# Patient Record
Sex: Female | Born: 1980
Health system: Southern US, Community
[De-identification: ages and names within clinical notes are randomized; demographics above are authoritative.]

## PROBLEM LIST (undated history)

## (undated) DIAGNOSIS — D649 Anemia, unspecified: Secondary | ICD-10-CM

## (undated) DIAGNOSIS — D259 Leiomyoma of uterus, unspecified: Secondary | ICD-10-CM

## (undated) DIAGNOSIS — Z9889 Other specified postprocedural states: Secondary | ICD-10-CM

## (undated) DIAGNOSIS — F419 Anxiety disorder, unspecified: Secondary | ICD-10-CM

## (undated) DIAGNOSIS — R112 Nausea with vomiting, unspecified: Secondary | ICD-10-CM

## (undated) DIAGNOSIS — N809 Endometriosis, unspecified: Secondary | ICD-10-CM

## (undated) DIAGNOSIS — F329 Major depressive disorder, single episode, unspecified: Secondary | ICD-10-CM

## (undated) DIAGNOSIS — F32A Depression, unspecified: Secondary | ICD-10-CM

## (undated) HISTORY — PX: MYOMECTOMY VAGINAL APPROACH: SUR871

## (undated) HISTORY — PX: LAPAROSCOPIC GELPORT ASSISTED MYOMECTOMY: SHX6549

## (undated) HISTORY — DX: Anxiety disorder, unspecified: F41.9

## (undated) HISTORY — DX: Endometriosis, unspecified: N80.9

## (undated) HISTORY — DX: Major depressive disorder, single episode, unspecified: F32.9

## (undated) HISTORY — DX: Depression, unspecified: F32.A

## (undated) HISTORY — DX: Leiomyoma of uterus, unspecified: D25.9

---

## 2012-05-07 HISTORY — PX: DILATION AND CURETTAGE OF UTERUS: SHX78

## 2014-09-15 HISTORY — PX: DILATION AND CURETTAGE OF UTERUS: SHX78

## 2015-07-08 LAB — HM PAP SMEAR: HM Pap smear: NEGATIVE

## 2015-10-07 ENCOUNTER — Other Ambulatory Visit: Payer: Self-pay | Admitting: Obstetrics and Gynecology

## 2015-10-07 DIAGNOSIS — N979 Female infertility, unspecified: Secondary | ICD-10-CM

## 2015-11-09 ENCOUNTER — Ambulatory Visit
Admission: RE | Admit: 2015-11-09 | Discharge: 2015-11-09 | Disposition: A | Discharge status: Home / Self Care | Payer: Managed Care, Other (non HMO) | Source: Ambulatory Visit | Attending: Obstetrics and Gynecology | Admitting: Obstetrics and Gynecology

## 2015-11-09 DIAGNOSIS — N979 Female infertility, unspecified: Secondary | ICD-10-CM | POA: Insufficient documentation

## 2015-11-09 MED ORDER — IOHEXOL 300 MG/ML  SOLN
75.0000 mL | Freq: Once | INTRAMUSCULAR | Status: AC | PRN
  Administered 2015-11-09: 20 mL via INTRAVENOUS

## 2015-11-10 NOTE — Procedures (Signed)
Prep and drape done.  Cervix normal.  Tenaculum placed. Cervix not dilated manually.  Catheter placed into uterine cavity. Approximately 15 mL IsoVue 300 dye injected under fluoroscopic visualization.  Patent right fallopian tube seen and no spillage seen of the left fallopian tube and no flow into the tube.  The uterus initially appeared to have an abnormal contour.  However, after some time a contour within normal limits was apparent.  Catheter and tenaculum removed.  Patient stable, tolerated procedure well.

## 2018-01-01 ENCOUNTER — Other Ambulatory Visit (HOSPITAL_COMMUNITY)
Admission: RE | Admit: 2018-01-01 | Discharge: 2018-01-01 | Disposition: A | Discharge status: Home / Self Care | Payer: Managed Care, Other (non HMO) | Source: Ambulatory Visit | Attending: Obstetrics and Gynecology | Admitting: Obstetrics and Gynecology

## 2018-01-01 ENCOUNTER — Encounter: Payer: Self-pay | Admitting: Obstetrics and Gynecology

## 2018-01-01 ENCOUNTER — Ambulatory Visit (INDEPENDENT_AMBULATORY_CARE_PROVIDER_SITE_OTHER): Payer: Managed Care, Other (non HMO) | Admitting: Obstetrics and Gynecology

## 2018-01-01 VITALS — BP 140/90 | HR 114 | Ht 67.0 in | Wt 217.0 lb

## 2018-01-01 DIAGNOSIS — Z01419 Encounter for gynecological examination (general) (routine) without abnormal findings: Secondary | ICD-10-CM | POA: Insufficient documentation

## 2018-01-01 DIAGNOSIS — D259 Leiomyoma of uterus, unspecified: Secondary | ICD-10-CM

## 2018-01-01 DIAGNOSIS — G8929 Other chronic pain: Secondary | ICD-10-CM | POA: Diagnosis not present

## 2018-01-01 DIAGNOSIS — R102 Pelvic and perineal pain: Secondary | ICD-10-CM | POA: Diagnosis not present

## 2018-01-01 DIAGNOSIS — Z1339 Encounter for screening examination for other mental health and behavioral disorders: Secondary | ICD-10-CM

## 2018-01-01 DIAGNOSIS — Z1331 Encounter for screening for depression: Secondary | ICD-10-CM | POA: Diagnosis not present

## 2018-01-01 DIAGNOSIS — N809 Endometriosis, unspecified: Secondary | ICD-10-CM

## 2018-01-01 DIAGNOSIS — Z124 Encounter for screening for malignant neoplasm of cervix: Secondary | ICD-10-CM

## 2018-01-01 NOTE — Progress Notes (Signed)
Gynecology Annual Exam  PCP: Patient, No Pcp Per  Chief Complaint  Patient presents with  . Gynecologic Exam    Talk about hysterectomy/ having anxiety     History of Present Illness:  Ms. Ashley Mooney is a 37 y.o. G1P0010 who LMP was No LMP recorded. (Menstrual status: Irregular Periods)., presents today for her annual examination.  Her menses are irregular, painful, and very heavy.  The last period she had was so heavy that she almost went to the ER.  At that point she was soaking a pad every 1/2 - 1 hour. She even noticed a vision change in her right eye.  The most recent period was 4 days in length. In July her period was 12 days long.  More typical for her is a 6-7 days.  She has a history of two D&Cs (one for a miscarriage, one for heavy bleeding). She has had two laparoscopic myomectomies and two hysteroscopic myomectomies.  She would like to discuss hysterectomy.  She had a significant history of endometriosis and fibroids and has had multiple surgeries for the same.   She is single partner, contraception - none.  Last Pap: 07/07/2105  Results were: no abnormalities /neg HPV DNA negative Hx of STDs: none  Last mammogram: n/a There is no FH of breast cancer. There is no FH of ovarian cancer. The patient does not do self-breast exams.  Tobacco use: 3 cigarettes per day Alcohol use: social drinker Exercise: moderately active  The patient wears seatbelts: yes.      Past Medical History:  Diagnosis Date  . Anxiety   . Depression   . Endometriosis   . Fibroid, uterine     Past Surgical History:  Procedure Laterality Date  . DILATION AND CURETTAGE OF UTERUS  09/15/2014   miscarriage   . DILATION AND CURETTAGE OF UTERUS  2014   Heavy bleeding   . LAPAROSCOPIC GELPORT ASSISTED MYOMECTOMY  [id number]  . MYOMECTOMY VAGINAL APPROACH  2014, 2015    Medications   Denies    Allergies  Allergen Reactions  . Oxycodone Rash  . Zoloft [Sertraline Hcl] Rash   Gynecologic  History: No LMP recorded. (Menstrual status: Irregular Periods).  Obstetric History: G1P0010  Social History   Socioeconomic History  . Marital status: Married    Spouse name: Not on file  . Number of children: Not on file  . Years of education: Not on file  . Highest education level: Not on file  Occupational History  . Not on file  Social Needs  . Financial resource strain: Not on file  . Food insecurity:    Worry: Not on file    Inability: Not on file  . Transportation needs:    Medical: Not on file    Non-medical: Not on file  Tobacco Use  . Smoking status: Light Tobacco Smoker  . Smokeless tobacco: Never Used  Substance and Sexual Activity  . Alcohol use: Yes    Comment: occasionally   . Drug use: Never  . Sexual activity: Yes    Birth control/protection: None  Lifestyle  . Physical activity:    Days per week: Not on file    Minutes per session: Not on file  . Stress: Not on file  Relationships  . Social connections:    Talks on phone: Not on file    Gets together: Not on file    Attends religious service: Not on file    Active member of club or organization:  Not on file    Attends meetings of clubs or organizations: Not on file    Relationship status: Not on file  . Intimate partner violence:    Fear of current or ex partner: Not on file    Emotionally abused: Not on file    Physically abused: Not on file    Forced sexual activity: Not on file  Other Topics Concern  . Not on file  Social History Narrative  . Not on file    Family History  Problem Relation Age of Onset  . Lung cancer Maternal Grandfather   . Colon cancer Paternal Grandfather     Review of Systems  Constitutional: Negative.   HENT: Negative.   Eyes: Negative.   Respiratory: Negative.   Cardiovascular: Negative.   Gastrointestinal: Negative.   Genitourinary: Negative.   Musculoskeletal: Negative.   Skin: Negative.   Neurological: Negative.   Psychiatric/Behavioral: Negative.       Physical Exam BP 140/90   Pulse (!) 114   Ht 5\' 7"  (1.702 m)   Wt 217 lb (98.4 kg)   BMI 33.99 kg/m    Physical Exam  Constitutional: She is oriented to person, place, and time. She appears well-developed and well-nourished. No distress.  Genitourinary: Pelvic exam was performed with patient supine. There is no rash, tenderness, lesion or injury on the right labia. There is no rash, tenderness, lesion or injury on the left labia. No erythema, tenderness or bleeding in the vagina. No signs of injury around the vagina. No vaginal discharge found.  Right adnexum displays mass. Right adnexum does not display tenderness and does not display fullness.  Left adnexum displays mass. Left adnexum does not display tenderness and does not display fullness. Cervix does not exhibit motion tenderness, lesion, discharge or polyp.   Uterus is enlarged, exhibiting a mass, irregular and mobile (minimally). Uterus is not tender.  Genitourinary Comments: Large mass associated with uterus/ovaries.  Difficult to distinguish between fibroid uterus versus ovarian cysts. Bimanual confirms that the mass extends to just above the umbilicus and is irregular and minimally mobile.   HENT:  Head: Normocephalic and atraumatic.  Eyes: EOM are normal. No scleral icterus.  Neck: Normal range of motion. Neck supple. No thyromegaly present.  Cardiovascular: Normal rate and regular rhythm. Exam reveals no gallop and no friction rub.  No murmur heard. Pulmonary/Chest: Effort normal and breath sounds normal. No respiratory distress. She has no wheezes. She has no rales. Right breast exhibits no inverted nipple, no mass, no nipple discharge, no skin change and no tenderness. Left breast exhibits no inverted nipple, no mass, no nipple discharge, no skin change and no tenderness.  Abdominal: Soft. Bowel sounds are normal. She exhibits mass (extending to just above umbilicus and laterally for 6-7 cm bilaterally, minimally mobile).  She exhibits no distension. There is no tenderness. There is no rebound and no guarding.  Musculoskeletal: Normal range of motion. She exhibits no edema or tenderness.  Lymphadenopathy:    She has no cervical adenopathy.       Right: No inguinal adenopathy present.       Left: No inguinal adenopathy present.  Neurological: She is alert and oriented to person, place, and time. No cranial nerve deficit.  Skin: Skin is warm and dry. No rash noted. No erythema.  Psychiatric: She has a normal mood and affect. Her behavior is normal. Judgment normal.   Female chaperone present for pelvic and breast  portions of the physical exam  Results: AUDIT  Questionnaire (screen for alcoholism): 3 PHQ-9: 8 GAD-7: 8  Assessment: 37 y.o. G44P0010 female here for routine annual gynecologic examination.  Plan: Problem List Items Addressed This Visit      Genitourinary   Fibroid uterus   Relevant Orders   US PELVIS (TRANSABDOMINAL ONLY)   US PELVIS TRANSVANGINAL NON-OB (TV ONLY)     Other   Chronic pelvic pain in female   Endometriosis    Other Visit Diagnoses    Women's annual routine gynecological examination    -  Primary   Relevant Orders   US PELVIS (TRANSABDOMINAL ONLY)   US PELVIS TRANSVANGINAL NON-OB (TV ONLY)   Cytology - PAP   Screening for depression       Screening for alcoholism       Pap smear for cervical cancer screening       Relevant Orders   Cytology - PAP     Screening: -- Blood pressure screen elevated: continued to monitor. -- Colonoscopy - not due -- Mammogram - not due -- Weight screening: obese: discussed management options, including lifestyle, dietary, and exercise. -- Depression screening negative (PHQ-9) -- Nutrition: normal -- cholesterol screening: not due for screening -- osteoporosis screening: not due -- tobacco screening: using: discussed quitting using the 5 A's -- alcohol screening: AUDIT questionnaire indicates low-risk usage. -- family history of  breast cancer screening: done. not at high risk. -- no evidence of domestic violence or intimate partner violence. -- STD screening: gonorrhea/chlamydia NAAT not collected per patient request. -- pap smear collected per ASCCP guidelines -- HPV vaccination series: not eligilbe  Fibroid uterus: very large uterus with possible fibroids (most likely given her history of recurrent fibroids). Etiology could be ovarian in origin, also.  Pelvic ultrasound both transabdominal and transvaginal to better assess.  Chronic pelvic pain: patient has a long history of this. She is sure she no longer wants to become pregnant. She has tried for 13 years to become pregnant and has not been successful.  She now has new findings on exam that are concerning for worsening fibroid disease.  Will get ultrasound to better characterize her uterus.  She requests a hysterectomy.  Will utilize ultrasound findings to determine best approach.  20 minutes spent in face to face discussion with > 50% spent in counseling,management, and coordination of care of her chronic pelvic pain and fibroid uterus, as well as her decision whether to have a hysterectomy.    Prentice Docker, MD 01/01/2018 6:23 PM

## 2018-01-07 LAB — CYTOLOGY - PAP
DIAGNOSIS: NEGATIVE
HPV (WINDOPATH): NOT DETECTED

## 2018-01-10 ENCOUNTER — Ambulatory Visit (INDEPENDENT_AMBULATORY_CARE_PROVIDER_SITE_OTHER): Payer: Managed Care, Other (non HMO)

## 2018-01-10 ENCOUNTER — Encounter: Payer: Self-pay | Admitting: Obstetrics and Gynecology

## 2018-01-10 ENCOUNTER — Ambulatory Visit (INDEPENDENT_AMBULATORY_CARE_PROVIDER_SITE_OTHER): Payer: Managed Care, Other (non HMO) | Admitting: Obstetrics and Gynecology

## 2018-01-10 VITALS — BP 118/78 | Ht 67.0 in | Wt 214.0 lb

## 2018-01-10 DIAGNOSIS — R1905 Periumbilic swelling, mass or lump: Secondary | ICD-10-CM

## 2018-01-10 DIAGNOSIS — N921 Excessive and frequent menstruation with irregular cycle: Secondary | ICD-10-CM

## 2018-01-10 DIAGNOSIS — R102 Pelvic and perineal pain: Secondary | ICD-10-CM | POA: Diagnosis not present

## 2018-01-10 DIAGNOSIS — Z01419 Encounter for gynecological examination (general) (routine) without abnormal findings: Secondary | ICD-10-CM

## 2018-01-10 DIAGNOSIS — D251 Intramural leiomyoma of uterus: Secondary | ICD-10-CM

## 2018-01-10 DIAGNOSIS — N809 Endometriosis, unspecified: Secondary | ICD-10-CM | POA: Diagnosis not present

## 2018-01-10 DIAGNOSIS — D259 Leiomyoma of uterus, unspecified: Secondary | ICD-10-CM

## 2018-01-10 DIAGNOSIS — G8929 Other chronic pain: Secondary | ICD-10-CM

## 2018-01-10 MED ORDER — MEDROXYPROGESTERONE ACETATE 10 MG PO TABS: ORAL_TABLET | ORAL | 2 refills | Status: DC

## 2018-01-10 NOTE — Progress Notes (Signed)
Gynecology Ultrasound Follow Up   Chief Complaint  Patient presents with  . Follow-up    U/S Follow Up  Chronic pelvic pain in female, endometriosis, fibroid uterus   History of Present Illness: Patient is a 37 y.o. female who presents today for ultrasound evaluation of the above .  Ultrasound demonstrates the following findings Adnexa: no masses seen  Uterus: anteverted with endometrial stripe  7.0 mm Additional: Two fibroids: 1 is anterior intramural measuring 1.4 x 0.85 cm, the second is fundal intramural: 1.8 x 1.24 cm.   She is fairly certain she wants a hysterectomy still.   Past Medical History:  Diagnosis Date  . Anxiety   . Depression   . Endometriosis   . Fibroid, uterine     Past Surgical History:  Procedure Laterality Date  . DILATION AND CURETTAGE OF UTERUS  09/15/2014   miscarriage   . DILATION AND CURETTAGE OF UTERUS  2014   Heavy bleeding   . LAPAROSCOPIC GELPORT ASSISTED MYOMECTOMY  [id number]  . MYOMECTOMY VAGINAL APPROACH  2014, 2015    Family History  Problem Relation Age of Onset  . Lung cancer Maternal Grandfather   . Colon cancer Paternal Grandfather     Social History   Socioeconomic History  . Marital status: Married    Spouse name: Not on file  . Number of children: Not on file  . Years of education: Not on file  . Highest education level: Not on file  Occupational History  . Not on file  Social Needs  . Financial resource strain: Not on file  . Food insecurity:    Worry: Not on file    Inability: Not on file  . Transportation needs:    Medical: Not on file    Non-medical: Not on file  Tobacco Use  . Smoking status: Light Tobacco Smoker  . Smokeless tobacco: Never Used  Substance and Sexual Activity  . Alcohol use: Yes    Comment: occasionally   . Drug use: Never  . Sexual activity: Yes    Birth control/protection: None  Lifestyle  . Physical activity:    Days per week: Not on file    Minutes per session: Not on  file  . Stress: Not on file  Relationships  . Social connections:    Talks on phone: Not on file    Gets together: Not on file    Attends religious service: Not on file    Active member of club or organization: Not on file    Attends meetings of clubs or organizations: Not on file    Relationship status: Not on file  . Intimate partner violence:    Fear of current or ex partner: Not on file    Emotionally abused: Not on file    Physically abused: Not on file    Forced sexual activity: Not on file  Other Topics Concern  . Not on file  Social History Narrative  . Not on file    Allergies  Allergen Reactions  . Oxycodone Rash  . Zoloft [Sertraline Hcl] Rash    Prior to Admission medications   Not on File    Physical Exam BP 118/78   Ht 5\' 7"  (1.702 m)   Wt 214 lb (97.1 kg)   LMP 12/14/2017   BMI 33.52 kg/m    General: NAD HEENT: normocephalic, anicteric Pulmonary: No increased work of breathing Extremities: no edema, erythema, or tenderness Neurologic: Grossly intact, normal gait Psychiatric: mood appropriate, affect full  US Pelvis (transabdominal Only)  Result Date: 01/10/2018 ULTRASOUND REPORT Location: [facility] Date of Service: 01/10/2018 Patient Name: Ashley Mooney DOB: Sep 11, 1980 MRN: [id number] Indications:Enlarged Uterus Findings: The uterus is anteverted and measures 7.52 x 5.88 x 4.00 cm. Echo texture is heterogenous with evidence of focal masses. Within the uterus are multiple suspected fibroids measuring: Fibroid 1:Anterior intramural measures 14.03 x 8.51 mm Fibroid 2:Fundal intramural measures 18.23 x 12.41 mm The Endometrium measures 6.99 mm. Right Ovary measures 3.81 x 2.34 x 1.82  cm. It is normal in appearance. Left Ovary measures 1.55 x 1.26 x 0.67 cm. It is normal in appearance. Survey of the adnexa demonstrates no adnexal masses. There is no free fluid in the cul de sac. Impression: 1. Anteverted uterus with heterogenous texture. 2. Anterior  intramural measures 14.03 x 8.51 mm 3. Fundal intramural measures 18.23 x 12.41 mm Mital bahen P Patel, RDMS The ultrasound images and findings were reviewed by me and I agree with the above report. Prentice Docker, MD, [facility], [facility] 01/10/2018 11:37 AM      Assessment: 36 y.o. G1P0010  1. Menorrhagia with irregular cycle   2. Endometriosis   3. Chronic pelvic pain in female   4. Uterine leiomyoma, unspecified location   5. Periumbilical mass      Plan: Problem List Items Addressed This Visit      Genitourinary   Fibroid uterus     Other   Chronic pelvic pain in female   Relevant Medications   medroxyPROGESTERone (PROVERA) 10 MG tablet   Endometriosis   Relevant Medications   medroxyPROGESTERone (PROVERA) 10 MG tablet   Menorrhagia with irregular cycle - Primary   Relevant Medications   medroxyPROGESTERone (PROVERA) 10 MG tablet    Other Visit Diagnoses    Periumbilical mass       Relevant Orders   US Abdomen Complete     Reviewed u/s results. Ultrasound does not account for mass palpated on abdomen. She notified me that she has a horseshoe kidney and that might be the mass. Will obtain abdominal ultrasound to verify.  Discussed treatment options for pain, heavy periods, and fibroids. She has failed myomectomy in the past. She is strongly considering surgery, but is not 100% yet. As she has tried to conceive and has gone to great lengths to do so in the past 13 years, I would like her to be 150% certain.  Discussed alternatives, like IUD, etc. She is strongly against any mechanical devices in her body. She will consider and let me know. Will give her provera to take for heavy bleeding in the mean time. Instructions provided on how to take.   20 minutes spent in face to face discussion with > 50% spent in counseling,management, and coordination of care of her menorrhagia, chronic pelvic pain, endometriosis, abdominal mass.  Prentice Docker, MD,  [facility], [facility] 01/10/2018 1:39 PM

## 2018-01-14 ENCOUNTER — Ambulatory Visit
Admission: RE | Admit: 2018-01-14 | Discharge: 2018-01-14 | Disposition: A | Discharge status: Home / Self Care | Payer: Self-pay | Source: Ambulatory Visit | Attending: Obstetrics and Gynecology | Admitting: Obstetrics and Gynecology

## 2018-01-14 ENCOUNTER — Other Ambulatory Visit: Payer: Self-pay | Admitting: Obstetrics and Gynecology

## 2018-01-14 ENCOUNTER — Ambulatory Visit
Admission: RE | Admit: 2018-01-14 | Discharge: 2018-01-14 | Disposition: A | Discharge status: Home / Self Care | Payer: Managed Care, Other (non HMO) | Source: Ambulatory Visit | Attending: Obstetrics and Gynecology | Admitting: Obstetrics and Gynecology

## 2018-01-14 DIAGNOSIS — R1905 Periumbilic swelling, mass or lump: Secondary | ICD-10-CM

## 2018-01-17 ENCOUNTER — Telehealth: Payer: Self-pay | Admitting: Obstetrics and Gynecology

## 2018-05-09 ENCOUNTER — Telehealth: Payer: Self-pay | Admitting: Obstetrics and Gynecology

## 2018-05-09 NOTE — Telephone Encounter (Signed)
Patient called about scheduling an MRI for a mass at her stomach, but I do not see orders. Thanks.

## 2018-05-14 NOTE — Telephone Encounter (Signed)
Patient sent message to f/u. Patient is willing to either f/u in office again if you want to check for mass, have ultrasound, MRI, or whatever you recommend.

## 2018-05-15 ENCOUNTER — Other Ambulatory Visit: Payer: Self-pay | Admitting: Obstetrics and Gynecology

## 2018-05-15 DIAGNOSIS — R1900 Intra-abdominal and pelvic swelling, mass and lump, unspecified site: Secondary | ICD-10-CM

## 2018-05-15 DIAGNOSIS — N921 Excessive and frequent menstruation with irregular cycle: Secondary | ICD-10-CM

## 2018-05-15 DIAGNOSIS — D259 Leiomyoma of uterus, unspecified: Secondary | ICD-10-CM

## 2018-05-15 DIAGNOSIS — R1909 Other intra-abdominal and pelvic swelling, mass and lump: Secondary | ICD-10-CM

## 2018-05-15 NOTE — Telephone Encounter (Signed)
Of course I entered it wrong. The requested order is now in. Thank you.

## 2018-05-15 NOTE — Telephone Encounter (Signed)
Order entered.  Thank you

## 2018-05-15 NOTE — Telephone Encounter (Signed)
Patient is aware of MRI on Friday, 05/30/2018 @ 9:00am, to arrive at the [facility] registration desk at 8:30am, and that she must be NPO 4 hrs prior. Patient is aware to expect a call from the [facility].

## 2018-05-29 ENCOUNTER — Ambulatory Visit: Payer: Managed Care, Other (non HMO)

## 2018-05-30 ENCOUNTER — Ambulatory Visit
Admission: RE | Admit: 2018-05-30 | Discharge: 2018-05-30 | Disposition: A | Discharge status: Home / Self Care | Payer: BLUE CROSS/BLUE SHIELD | Source: Ambulatory Visit | Attending: Obstetrics and Gynecology | Admitting: Obstetrics and Gynecology

## 2018-05-30 ENCOUNTER — Telehealth: Payer: Self-pay | Admitting: Obstetrics and Gynecology

## 2018-05-30 DIAGNOSIS — D259 Leiomyoma of uterus, unspecified: Secondary | ICD-10-CM | POA: Diagnosis present

## 2018-05-30 DIAGNOSIS — R1900 Intra-abdominal and pelvic swelling, mass and lump, unspecified site: Secondary | ICD-10-CM | POA: Diagnosis not present

## 2018-05-30 DIAGNOSIS — R1909 Other intra-abdominal and pelvic swelling, mass and lump: Secondary | ICD-10-CM | POA: Diagnosis present

## 2018-05-30 MED ORDER — GADOBUTROL 1 MMOL/ML IV SOLN
9.0000 mL | Freq: Once | INTRAVENOUS | Status: AC | PRN
  Administered 2018-05-30: 9 mL via INTRAVENOUS

## 2018-05-30 NOTE — Telephone Encounter (Signed)
Discussed findings of MRI. She will make appointment to go over options as she is not so sure she wants a hysterectomy anymore.

## 2018-06-26 ENCOUNTER — Telehealth: Payer: Self-pay | Admitting: Obstetrics and Gynecology

## 2018-06-26 NOTE — Telephone Encounter (Signed)
Lmtrc in response to patient's MyChart message regarding charges for an MRI at [facility].

## 2018-06-30 NOTE — Telephone Encounter (Signed)
Patient called back this morning and said when she checked in at registration, she was told she would be responsible for ~$2000, then was asked if she changed insurance, she replied yes, and was told it looked like she was covered, and was given a printout showing estimated costs of $0. The patient received a bill for almost $4000. Patient is trying to understand why she owes the amount she was billed for. The patient is aware Jola Babinski is in a different service area and we cannot see her charges. Patient confirmed she has BCBS, said her family deductible is $150, and that she doesn't know her coinsurance. Patient did not have an EOB or the statement with her. Patient said she would have her husband contact [facility].

## 2019-07-18 IMAGING — MR MR ABDOMEN WO/W CM
20 series · 48 of 48 positions shown · IV contrast (gadavist)
Comparison: None.

CLINICAL DATA: Palpable abdominal mass on exam.

EXAM:
MRI ABDOMEN WITHOUT AND WITH CONTRAST
TECHNIQUE: Multiplanar multisequence MR imaging of the abdomen was performed
both before and after the administration of intravenous contrast.
CONTRAST:  9 mL Gadavist

[Series 2: T2 · coronal · 6.0mm · 1.19mm/px · 2 of 30 slices shown (1 of 2)]
[im 1/30]
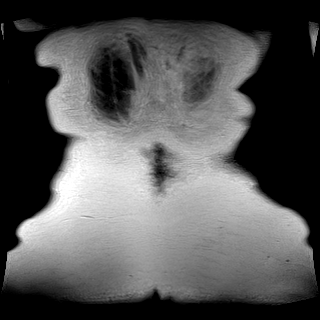
[im 30/30]
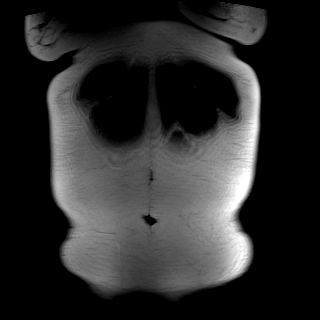

[Series 6: T2 fat-sat · axial · 6.0mm · 1.19mm/px · z∈[-104,+141]mm · 2 of 35 slices shown]
[im 1/35]
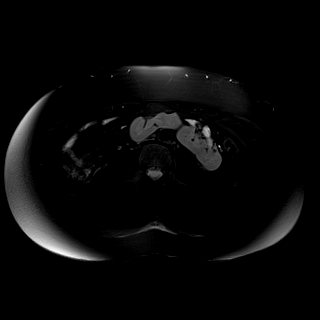
[im 35/35]
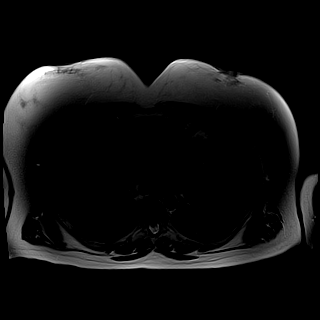

[Series 7: T2 · axial · 6.0mm · 1.19mm/px · z∈[-125,+98]mm · 2 of 32 slices shown (2 of 2)]
[im 1/32]
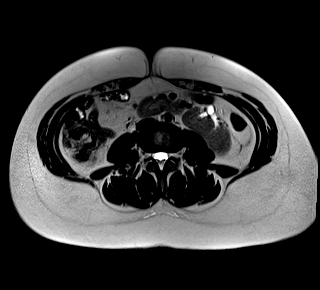
[im 32/32]
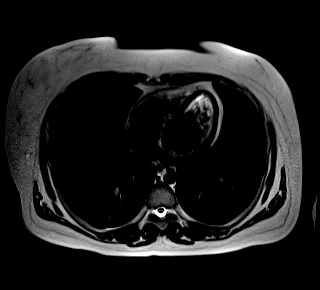

[Series 8: ax dwi_tracew · axial · 6.0mm · 1.42mm/px · z∈[-108,+144]mm · 3 of 36 slices shown (1 of 3)]
[im 1/36]
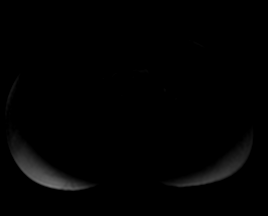
[im 18/36]
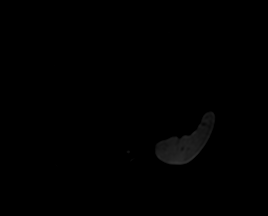
[im 36/36]
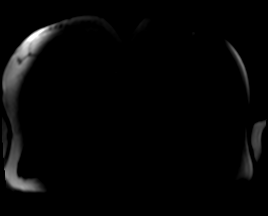

[Series 8: ax dwi_tracew · axial · 6.0mm · 1.42mm/px · z∈[-108,+144]mm · 2 of 36 slices shown (2 of 3)]
[im 1/36]
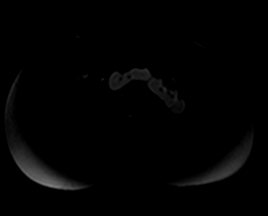
[im 36/36]
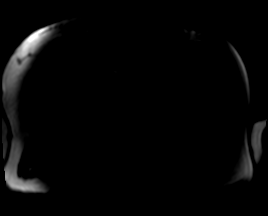

[Series 8: ax dwi_tracew · axial · 6.0mm · 1.42mm/px · z∈[-108,+144]mm · 2 of 36 slices shown (3 of 3)]
[im 1/36]
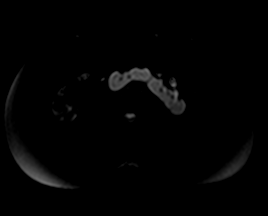
[im 36/36]
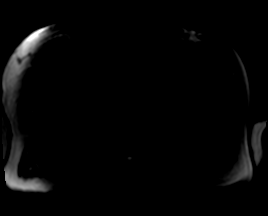

[Series 9: ax dwi_adc · axial · 6.0mm · 1.42mm/px · z∈[-108,+144]mm · 2 of 36 slices shown]
[im 1/36]
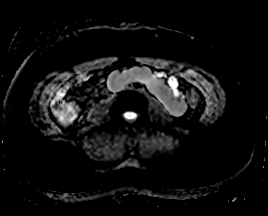
[im 36/36]
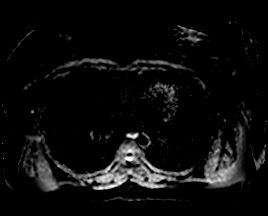

[Series 10: T1 · axial · 6.0mm · 0.74mm/px · 1 of 32 slices shown (1 of 2)]
[im 1/32]
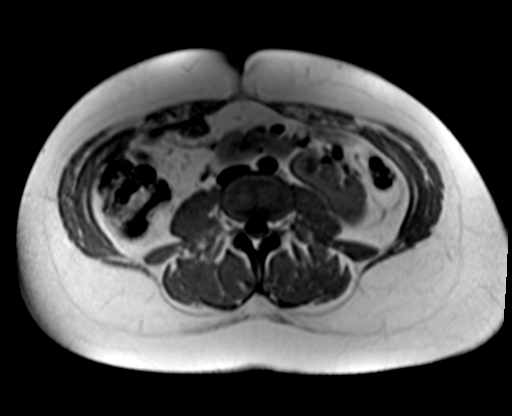

[Series 10: T1 · axial · 6.0mm · 0.74mm/px · 1 of 32 slices shown (2 of 2)]
[im 1/32]
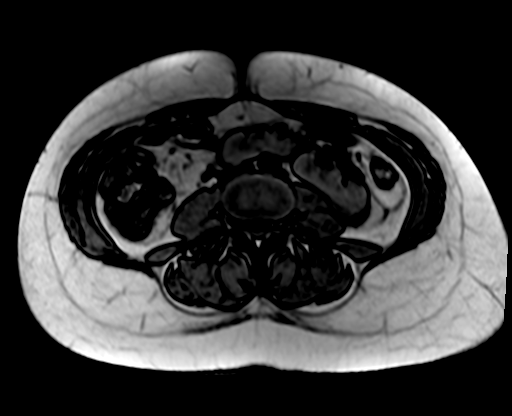

[Series 11: bSSFP · axial · 6.0mm · 0.74mm/px · 1 of 32 slices shown]
[im 1/32]
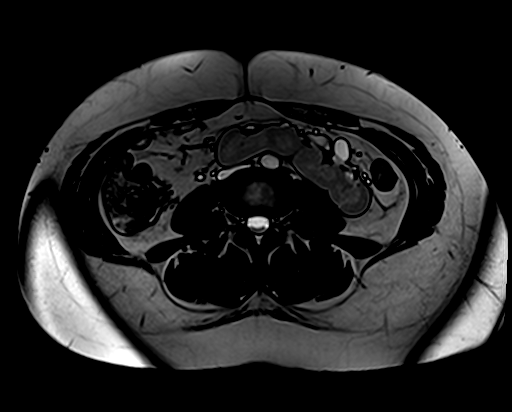

[Series 12: T1 dynamic fat-sat · axial · non-contrast · 3.0mm · 1.19mm/px · z∈[-131,+106]mm · 3 of 80 slices shown (1 of 5)]
[im 1/80]
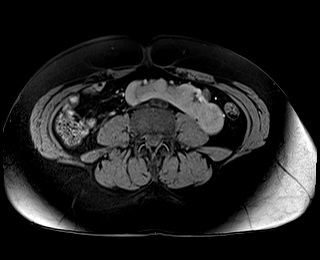
[im 40/80]
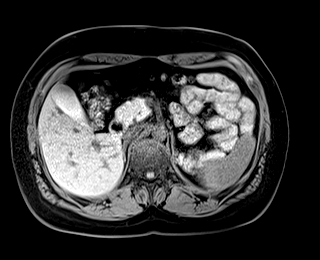
[im 80/80]
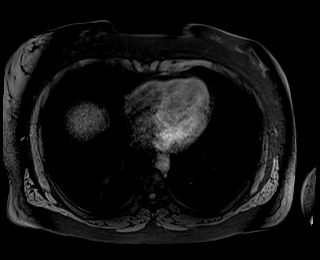

[Series 13: T1 dynamic fat-sat post-contrast · axial · 3.0mm · 1.19mm/px · z∈[-131,+106]mm · 3 of 80 slices shown (1 of 4)]
[im 1/80]
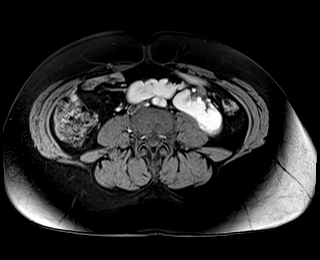
[im 40/80]
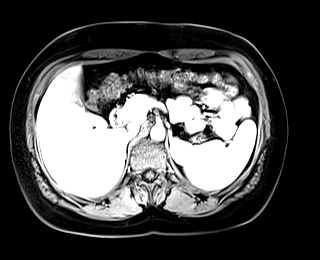
[im 80/80]
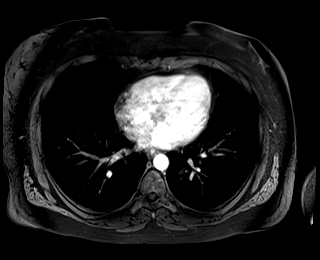

[Series 14: T1 dynamic fat-sat · axial · 3.0mm · 1.19mm/px · z∈[-131,+106]mm · 3 of 80 slices shown (2 of 5)]
[im 1/80]
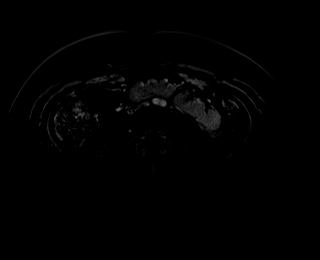
[im 40/80]
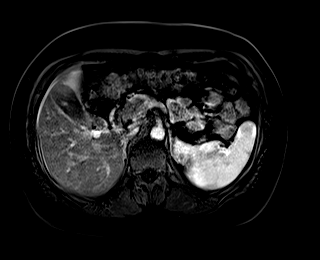
[im 80/80]
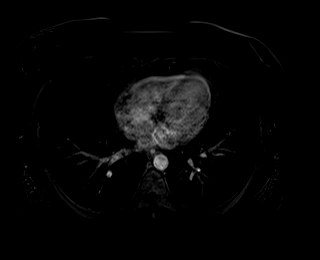

[Series 15: T1 dynamic fat-sat post-contrast · axial · 3.0mm · 1.19mm/px · z∈[-131,+106]mm · 3 of 80 slices shown (2 of 4)]
[im 1/80]
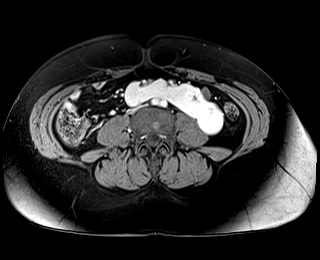
[im 40/80]
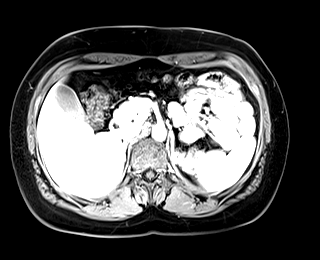
[im 80/80]
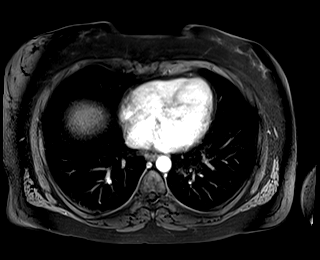

[Series 16: T1 dynamic fat-sat · axial · 3.0mm · 1.19mm/px · z∈[-131,+106]mm · 3 of 80 slices shown (3 of 5)]
[im 1/80]
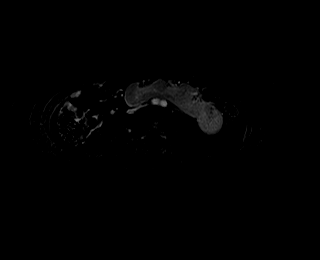
[im 40/80]
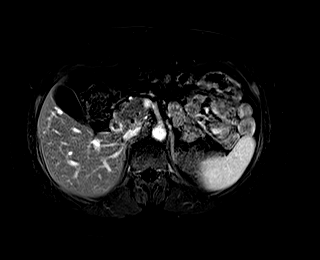
[im 80/80]
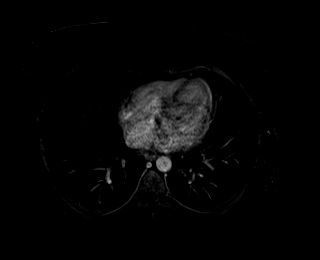

[Series 17: T1 dynamic fat-sat post-contrast · axial · 3.0mm · 1.19mm/px · z∈[-131,+106]mm · 3 of 80 slices shown (3 of 4)]
[im 1/80]
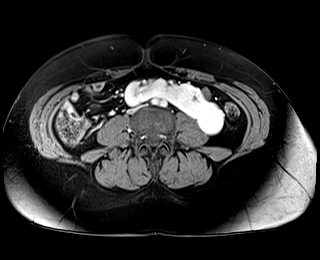
[im 40/80]
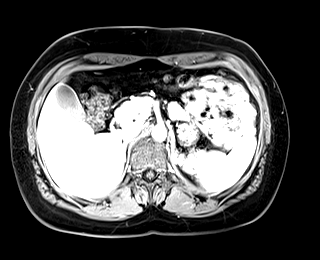
[im 80/80]
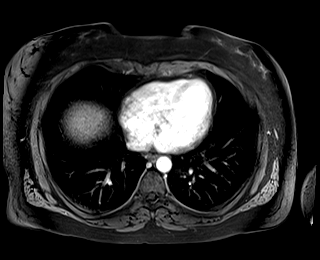

[Series 18: T1 dynamic fat-sat · axial · 3.0mm · 1.19mm/px · z∈[-131,+106]mm · 3 of 80 slices shown (4 of 5)]
[im 1/80]
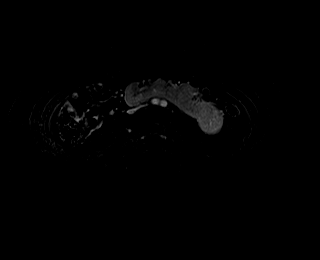
[im 40/80]
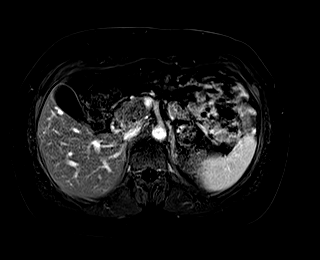
[im 80/80]
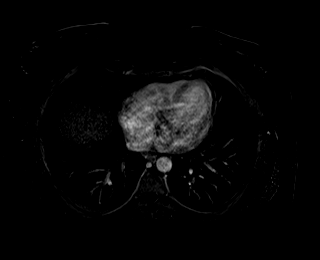

[Series 19: T1 dynamic post-contrast · coronal · 3.0mm · 1.31mm/px · 3 of 72 slices shown]
[im 1/72]
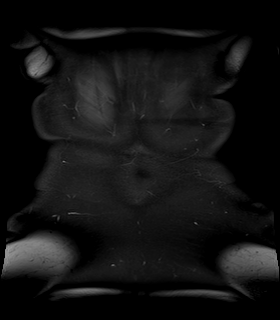
[im 36/72]
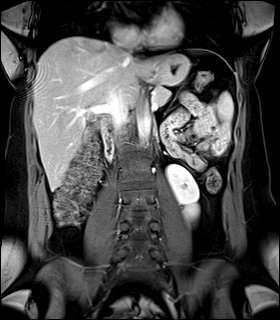
[im 72/72]
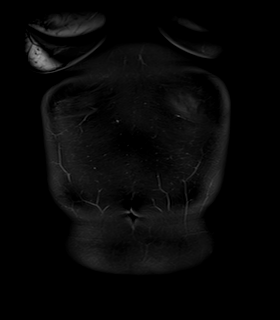

[Series 20: T1 dynamic fat-sat post-contrast · axial · 3.0mm · 1.19mm/px · z∈[-131,+106]mm · 3 of 80 slices shown (4 of 4)]
[im 1/80]
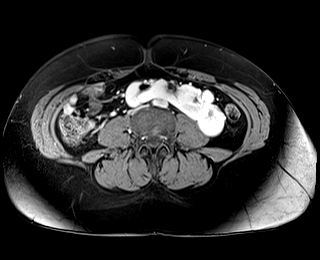
[im 40/80]
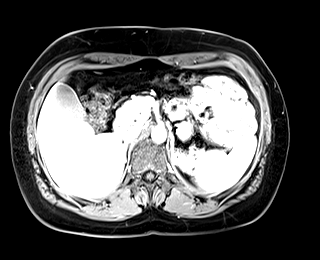
[im 80/80]
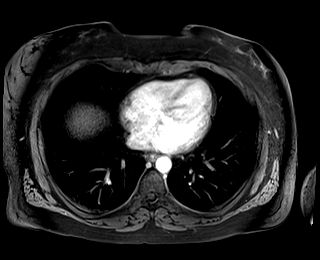

[Series 21: T1 dynamic fat-sat · axial · 3.0mm · 1.19mm/px · z∈[-131,+106]mm · 3 of 80 slices shown (5 of 5)]
[im 1/80]
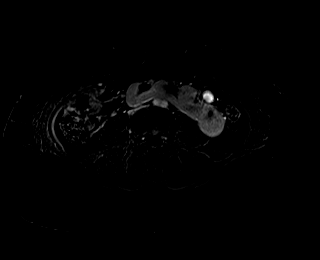
[im 40/80]
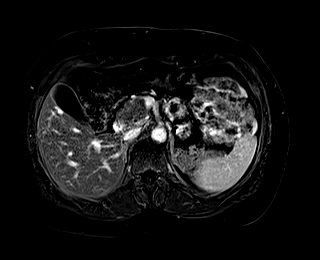
[im 80/80]
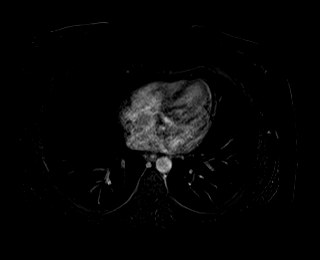

[48 of 48 positions shown; findings below may reference images not displayed]

FINDINGS: Lower chest: No acute findings.

Hepatobiliary: No hepatic masses identified. Gallbladder is
unremarkable. No evidence of biliary ductal dilatation.

Pancreas:  No mass or inflammatory changes.

Spleen:  Within normal limits in size and appearance.

Adrenals/Urinary Tract: Normal adrenal glands. A horseshoe kidney is
seen lower abdomen and upper pelvis. A small cyst is seen in the
right renal moiety, however there is no evidence of renal masses or
hydronephrosis.

Stomach/Bowel: Visualized portion unremarkable.

Vascular/Lymphatic: No pathologically enlarged lymph nodes
identified. No abdominal aortic aneurysm.

Other:  None.

Musculoskeletal:  No suspicious bone lesions identified.
IMPRESSION: Horseshoe kidney in the lower abdomen/upper pelvis.

No evidence of abdominal soft tissue mass or other acute findings.

## 2020-09-06 NOTE — Telephone Encounter (Signed)
error 

## 2020-10-06 ENCOUNTER — Telehealth: Payer: Self-pay

## 2020-10-06 NOTE — Telephone Encounter (Signed)
This patient has not been seen in 2 years. She needs an appointment before providing a prescription- please schedule for her to be seen within the week. Thank you.

## 2020-10-06 NOTE — Telephone Encounter (Signed)
Pt has been bleeding for 17 days, she has a rx for provera but its expired, Can you send in a new RX also she wanted to know is there anything stronger to stop the bleeding? Please advise since SDJ is out of the office.

## 2020-10-07 NOTE — Telephone Encounter (Signed)
Please call and schedule with sdj

## 2020-10-11 ENCOUNTER — Other Ambulatory Visit: Payer: Self-pay | Admitting: Obstetrics and Gynecology

## 2020-10-11 DIAGNOSIS — N921 Excessive and frequent menstruation with irregular cycle: Secondary | ICD-10-CM

## 2020-10-11 MED ORDER — NORGESTIMATE-ETH ESTRADIOL 0.25-35 MG-MCG PO TABS: 3.0000 | ORAL_TABLET | Freq: Every day | ORAL | 0 refills | Status: DC

## 2020-10-21 ENCOUNTER — Ambulatory Visit (INDEPENDENT_AMBULATORY_CARE_PROVIDER_SITE_OTHER): Payer: BC Managed Care – PPO | Admitting: Obstetrics and Gynecology

## 2020-10-21 ENCOUNTER — Other Ambulatory Visit: Payer: Self-pay

## 2020-10-21 ENCOUNTER — Encounter: Payer: Self-pay | Admitting: Obstetrics and Gynecology

## 2020-10-21 VITALS — BP 130/80 | Ht 67.0 in | Wt 189.0 lb

## 2020-10-21 DIAGNOSIS — N920 Excessive and frequent menstruation with regular cycle: Secondary | ICD-10-CM | POA: Diagnosis not present

## 2020-10-21 DIAGNOSIS — D259 Leiomyoma of uterus, unspecified: Secondary | ICD-10-CM | POA: Diagnosis not present

## 2020-10-21 MED ORDER — NORGESTIMATE-ETH ESTRADIOL 0.25-35 MG-MCG PO TABS: 1.0000 | ORAL_TABLET | Freq: Two times a day (BID) | ORAL | 1 refills | Status: DC

## 2020-10-21 NOTE — Progress Notes (Signed)
Obstetrics & Gynecology Office Visit   Chief Complaint  Patient presents with   Menorrhagia    Severe cramping    History of Present Illness: 40 y.o. G60P0010 female who presents with abnormal periods. She skipped two periods last year.  She had a normal (in timing) in May.  The period kept going after the normal eight days.  She took the medicine I had given her a few years ago.  She was trying to take Sprintec three a day (this made her sick). So, she took two pills per day.  She skipped one day to see how things would go. With this she bled and had a gigantic clot (size of a jumbo Crayola marker).  She also had bad cramps. Prior to this she had been having monthly periods, lasting 7-8 days.  This has been fine for about 10 years ago.   She would like to get a hysterectomy.  She has two days of pills remaining.   She has last over 32 pounds over four months, intentionally.    She has had multiple myomectomies.  One of them laparoscopically with a gel-port for assistance.    Last pap smear: 01/01/2018: NILM, HPV negative  See u/s from 2019  H/o horeshoe kidney in lower abdomen, lower pelvis on MRI from 2019. I palpated a mass and was unsure what the mass was at this point.  Past Medical History:  Diagnosis Date   Anxiety    Depression    Endometriosis    Fibroid, uterine     Past Surgical History:  Procedure Laterality Date   DILATION AND CURETTAGE OF UTERUS  09/15/2014   miscarriage    DILATION AND CURETTAGE OF UTERUS  2014   Heavy bleeding    LAPAROSCOPIC GELPORT ASSISTED MYOMECTOMY  [id number],7322   MYOMECTOMY VAGINAL APPROACH  2014, 2015    Gynecologic History: No LMP recorded. (Menstrual status: Irregular Periods).  Obstetric History: G1P0010  Family History  Problem Relation Age of Onset   Lung cancer Maternal Grandfather    Colon cancer Paternal Grandfather     Social History   Socioeconomic History   Marital status: Married    Spouse name: Not on file    Number of children: Not on file   Years of education: Not on file   Highest education level: Not on file  Occupational History   Not on file  Tobacco Use   Smoking status: Light Smoker    Pack years: 0.00   Smokeless tobacco: Never  Vaping Use   Vaping Use: Every day  Substance and Sexual Activity   Alcohol use: Yes    Comment: occasionally    Drug use: Never   Sexual activity: Yes    Birth control/protection: Pill  Other Topics Concern   Not on file  Social History Narrative   Not on file   Social Determinants of Health   Financial Resource Strain: Not on file  Food Insecurity: Not on file  Transportation Needs: Not on file  Physical Activity: Not on file  Stress: Not on file  Social Connections: Not on file  Intimate Partner Violence: Not on file    Allergies  Allergen Reactions   Oxycodone Rash   Zoloft [Sertraline Hcl] Rash    Prior to Admission medications   Medication Sig Start Date End Date Taking? Authorizing Provider  APAP-Pamabrom-Pyrilamine (PAMPRIN MULTI-SYMPTOM) 500-25-15 MG TABS  09/20/20  Yes [provider]  Iron, Ferrous Sulfate, 325 (65 Fe) MG TABS  10/07/20  Yes [provider]  Melatonin 5 MG CAPS  04/19/19  Yes [provider]  norgestimate-ethinyl estradiol ([place] 28) 0.25-35 MG-MCG tablet Take 3 tablets by mouth daily for 7 days. 10/11/20 10/18/20  Will Bonnet, MD    Review of Systems  Constitutional: Negative.   HENT: Negative.    Eyes: Negative.   Respiratory: Negative.    Cardiovascular: Negative.   Gastrointestinal: Negative.   Genitourinary: Negative.   Musculoskeletal: Negative.   Skin: Negative.   Neurological: Negative.   Psychiatric/Behavioral: Negative.      Physical Exam BP 130/80   Ht 5\' 7"  (1.702 m)   Wt 189 lb (85.7 kg)   BMI 29.60 kg/m  No LMP recorded. (Menstrual status: Irregular Periods). Physical Exam Constitutional:      General: She is not in acute distress.    Appearance:  Normal appearance. She is well-developed.  Genitourinary:     Vulva, bladder and urethral meatus normal.     Right Labia: No rash, tenderness, lesions, skin changes or Bartholin's cyst.    Left Labia: No tenderness, skin changes, Bartholin's cyst or rash.    No inguinal adenopathy present in the right or left side.    Pelvic Tanner Score: 5/5.     Right Adnexa: not tender, not full and no mass present.    Left Adnexa: not tender, not full and no mass present.    No cervical motion tenderness, friability, lesion or polyp.     Uterus is not enlarged, fixed or tender.     Uterus is anteverted.     No urethral tenderness or mass present.     Pelvic exam was performed with patient in the lithotomy position.  HENT:     Head: Normocephalic and atraumatic.  Eyes:     General: No scleral icterus.    Conjunctiva/sclera: Conjunctivae normal.  Cardiovascular:     Rate and Rhythm: Normal rate and regular rhythm.     Heart sounds: No murmur heard.   No friction rub. No gallop.  Pulmonary:     Effort: Pulmonary effort is normal. No respiratory distress.     Breath sounds: Normal breath sounds. No wheezing or rales.  Abdominal:     General: Bowel sounds are normal. There is no distension.     Palpations: Abdomen is soft. There is no mass.     Tenderness: There is no abdominal tenderness. There is no guarding or rebound.     Hernia: There is no hernia in the left inguinal area or right inguinal area.  Musculoskeletal:        General: Normal range of motion.     Cervical back: Normal range of motion and neck supple.  Lymphadenopathy:     Lower Body: No right inguinal adenopathy. No left inguinal adenopathy.  Neurological:     General: No focal deficit present.     Mental Status: She is alert and oriented to person, place, and time.     Cranial Nerves: No cranial nerve deficit.  Skin:    General: Skin is warm and dry.     Findings: No erythema.  Psychiatric:        Mood and Affect: Mood  normal.        Behavior: Behavior normal.        Judgment: Judgment normal.    Female chaperone present for pelvic and breast  portions of the physical exam  Assessment: 40 y.o. G61P0010 female here for  1. Menorrhagia with regular cycle  2. Uterine leiomyoma, unspecified location      Plan: Problem List Items Addressed This Visit       Genitourinary   Fibroid uterus   Relevant Medications   norgestimate-ethinyl estradiol ([place]PRINT[place] 28) 0.25-35 MG-MCG tablet   Other Relevant Orders   US PELVIC COMPLETE WITH TRANSVAGINAL   Other Visit Diagnoses     Menorrhagia with regular cycle    -  Primary   Relevant Medications   norgestimate-ethinyl estradiol (SPRINTEC 28) 0.25-35 MG-MCG tablet   Other Relevant Orders   US PELVIC COMPLETE WITH TRANSVAGINAL        ICD-10-CM   1. Menorrhagia with regular cycle  N92.0 norgestimate-ethinyl estradiol (SPRINTEC 28) 0.25-35 MG-MCG tablet    US PELVIC COMPLETE WITH TRANSVAGINAL    2. Uterine leiomyoma, unspecified location  D25.9 norgestimate-ethinyl estradiol (SPRINTEC 28) 0.25-35 MG-MCG tablet    US PELVIC COMPLETE WITH TRANSVAGINAL      Based on her surgical history, it is very likely that she has recurrence of fibroids.  She has tried medication therapy, which did not help.  I believe hysterectomy is warranted in this case.  Given her significant surgical history, and horseshoe kidney, will ask to have Dr. Theora Gianotti, gynecologic oncologist, on standby for significant adhesive disease, if present.  I will also obtain a pelvic ultrasound for planning of surgery.  We discussed alternatives to surgery in detail.  She presents today quite ready for hysterectomy after having lived with this issue for quite a while. If I am unable to get an ultrasound scheduled in a timely fashion, I will perform 1 in the office.  A total of 24 minutes were spent face-to-face with the patient as well as preparation, review, communication, and documentation during  this encounter.    Prentice Docker, MD 10/21/2020 11:53 AM

## 2020-10-22 ENCOUNTER — Encounter: Payer: Self-pay | Admitting: Obstetrics and Gynecology

## 2020-10-24 ENCOUNTER — Telehealth: Payer: Self-pay | Admitting: Obstetrics and Gynecology

## 2020-10-24 NOTE — Telephone Encounter (Signed)
This pt called in and wants to reschedule her July 6th surgery with Dr. Glennon Mac.

## 2020-10-31 NOTE — Telephone Encounter (Signed)
I called and LVM for pt to rtn call to re-schedule surgery. She rtn'd call and I offered her 8/31 and a new DOS. She was disappointed that it is so far out. I explained that coordinating surgery between the two providers is difficult as their schedules vary. She had several questions   - Does the other Dr have to be present Theora Gianotti)?  - Can she resume taking the Rx to control bleeding until surgery? She finally stopped bleeding 2-3 weeks. Last Friday she stopped taking the medication and today she restarted the heavy bleeding today, so far passing 2 golf ball size clots today.  - Is there any way surgery can be done any earlier than 8/30? She and husband have a prepaid vacation planned that creates the conflict on 7/6.  I told her I would send her questions to [place].

## 2020-11-01 ENCOUNTER — Other Ambulatory Visit: Payer: Managed Care, Other (non HMO)

## 2020-11-04 ENCOUNTER — Encounter: Payer: BC Managed Care – PPO | Admitting: Obstetrics and Gynecology

## 2020-11-25 DIAGNOSIS — D251 Intramural leiomyoma of uterus: Secondary | ICD-10-CM | POA: Diagnosis not present

## 2020-11-25 DIAGNOSIS — N920 Excessive and frequent menstruation with regular cycle: Secondary | ICD-10-CM | POA: Diagnosis not present

## 2020-11-25 DIAGNOSIS — D259 Leiomyoma of uterus, unspecified: Secondary | ICD-10-CM | POA: Diagnosis not present

## 2020-12-26 ENCOUNTER — Encounter: Payer: Self-pay | Admitting: Urgent Care

## 2020-12-26 ENCOUNTER — Other Ambulatory Visit: Payer: Self-pay

## 2020-12-26 ENCOUNTER — Other Ambulatory Visit
Admission: RE | Admit: 2020-12-26 | Discharge: 2020-12-26 | Disposition: A | Discharge status: Home / Self Care | Payer: BC Managed Care – PPO | Source: Ambulatory Visit | Attending: Obstetrics and Gynecology | Admitting: Obstetrics and Gynecology

## 2020-12-26 ENCOUNTER — Ambulatory Visit (INDEPENDENT_AMBULATORY_CARE_PROVIDER_SITE_OTHER): Payer: BC Managed Care – PPO | Admitting: Obstetrics and Gynecology

## 2020-12-26 ENCOUNTER — Encounter: Payer: Self-pay | Admitting: Obstetrics and Gynecology

## 2020-12-26 ENCOUNTER — Encounter
Admission: RE | Admit: 2020-12-26 | Discharge: 2020-12-26 | Disposition: A | Discharge status: Home / Self Care | Payer: BC Managed Care – PPO | Source: Ambulatory Visit | Attending: Obstetrics and Gynecology | Admitting: Obstetrics and Gynecology

## 2020-12-26 VITALS — BP 122/70 | Ht 67.0 in | Wt 189.0 lb

## 2020-12-26 DIAGNOSIS — Z01812 Encounter for preprocedural laboratory examination: Secondary | ICD-10-CM | POA: Diagnosis not present

## 2020-12-26 DIAGNOSIS — D259 Leiomyoma of uterus, unspecified: Secondary | ICD-10-CM

## 2020-12-26 DIAGNOSIS — N921 Excessive and frequent menstruation with irregular cycle: Secondary | ICD-10-CM

## 2020-12-26 HISTORY — DX: Anemia, unspecified: D64.9

## 2020-12-26 HISTORY — DX: Nausea with vomiting, unspecified: R11.2

## 2020-12-26 HISTORY — DX: Other specified postprocedural states: Z98.890

## 2020-12-26 LAB — COMPREHENSIVE METABOLIC PANEL
ALT: 12 U/L (ref 0–44)
AST: 15 U/L (ref 15–41)
Albumin: 3.9 g/dL (ref 3.5–5.0)
Alkaline Phosphatase: 49 U/L (ref 38–126)
Anion gap: 6 (ref 5–15)
BUN: 16 mg/dL (ref 6–20)
CO2: 28 mmol/L (ref 22–32)
Calcium: 9.3 mg/dL (ref 8.9–10.3)
Chloride: 105 mmol/L (ref 98–111)
Creatinine, Ser: 0.81 mg/dL (ref 0.44–1.00)
GFR, Estimated: >60 mL/min (ref >60)
Glucose, Bld: 78 mg/dL (ref 70–99)
Potassium: 3.6 mmol/L (ref 3.5–5.1)
Sodium: 139 mmol/L (ref 135–145)
Total Bilirubin: 0.4 mg/dL (ref 0.3–1.2)
Total Protein: 6.7 g/dL (ref 6.5–8.1)

## 2020-12-26 LAB — CBC
HCT: 34.4 % — ABNORMAL LOW (ref 36.0–46.0)
Hemoglobin: 11.3 g/dL — ABNORMAL LOW (ref 12.0–15.0)
MCH: 29 pg (ref 26.0–34.0)
MCHC: 32.8 g/dL (ref 30.0–36.0)
MCV: 88.2 fL (ref 80.0–100.0)
Platelets: 311 10*3/uL (ref 150–400)
RBC: 3.9 MIL/uL (ref 3.87–5.11)
RDW: 12.7 % (ref 11.5–15.5)
WBC: 5.3 10*3/uL (ref 4.0–10.5)
nRBC: 0 % (ref 0.0–0.2)

## 2020-12-26 LAB — TYPE AND SCREEN
ABO/RH(D): B POS
Antibody Screen: NEGATIVE

## 2020-12-26 NOTE — Patient Instructions (Addendum)
Your procedure is scheduled on: January 04, 2021 [facility] Report to the Registration Desk on the 1st floor of the [facility]. To find out your arrival time, please call [phone] between 1PM - 3PM on: January 03, 2021 TUESDAY  REMEMBER: Instructions that are not followed completely may result in serious medical risk, up to and including death; or upon the discretion of your surgeon and anesthesiologist your surgery may need to be rescheduled.  Do not eat food after midnight the night before surgery.  No gum chewing, lozengers or hard candies.  You may however, drink CLEAR liquids up to 2 hours before you are scheduled to arrive for your surgery. Do not drink anything within 2 hours of your scheduled arrival time.  Clear liquids include: - water  - apple juice without pulp - gatorade (not RED, PURPLE, OR BLUE) - black coffee or tea (Do NOT add milk or creamers to the coffee or tea) Do NOT drink anything that is not on this list.  Type 1 and Type 2 diabetics should only drink water.  In addition, your doctor has ordered for you to drink the provided  Ensure Pre-Surgery Clear Carbohydrate Drink  Drinking this carbohydrate drink up to two hours before surgery helps to reduce insulin resistance and improve patient outcomes. Please complete drinking 2 hours prior to scheduled arrival time.  TAKE THESE MEDICATIONS THE MORNING OF SURGERY WITH A SIP OF WATER: NONE  One week prior to surgery: Stop Anti-inflammatories (NSAIDS) such as Advil, Aleve, Ibuprofen, Motrin, Naproxen, Naprosyn and ASPRIN OR Aspirin based products such as Excedrin, Goodys Powder, BC Powder. Stop ANY OVER THE COUNTER supplements until after surgery. You may however, continue to take Tylenol if needed for pain up until the day of surgery.  No Alcohol for 24 hours before or after surgery.  No Smoking including e-cigarettes for 24 hours prior to surgery.  No chewable tobacco products for at least 6 hours prior to  surgery.  No nicotine patches on the day of surgery.  Do not use any "recreational" drugs for at least a week prior to your surgery.  Please be advised that the combination of cocaine and anesthesia may have negative outcomes, up to and including death. If you test positive for cocaine, your surgery will be cancelled.  On the morning of surgery brush your teeth with toothpaste and water, you may rinse your mouth with mouthwash if you wish. Do not swallow any toothpaste or mouthwash.  Do not wear jewelry, make-up, hairpins, clips or nail polish.  Do not wear lotions, powders, or perfumes OR DEODORANT  Do not shave body from the neck down 48 hours prior to surgery just in case you cut yourself which could leave a site for infection.  Also, freshly shaved skin may become irritated if using the CHG soap.  Contact lenses, hearing aids and dentures may not be worn into surgery.  Do not bring valuables to the hospital. [facility] is not responsible for any missing/lost belongings or valuables.   Use CHG Soap as directed on instruction sheet.  Notify your doctor if there is any change in your medical condition (cold, fever, infection).  Wear comfortable clothing (specific to your surgery type) to the hospital.  After surgery, you can help prevent lung complications by doing breathing exercises.  Take deep breaths and cough every 1-2 hours. Your doctor may order a device called an Incentive Spirometer to help you take deep breaths. When coughing or sneezing, hold a pillow firmly  against your incision with both hands. This is called "splinting." Doing this helps protect your incision. It also decreases belly discomfort.  If you are being discharged the day of surgery, you will not be allowed to drive home. You will need a responsible adult (18 years or older) to drive you home and stay with you that night.   If you are taking public transportation, you will need to have a responsible adult  (18 years or older) with you. Please confirm with your physician that it is acceptable to use public transportation.   Please call the [place] [facility] at [phone] if you have any questions about these instructions.  Surgery Visitation Policy:  Patients undergoing a surgery or procedure may have one family member or support person with them as long as that person is not COVID-19 positive or experiencing its symptoms.  That person may remain in the waiting area during the procedure.  Inpatient Visitation:    Visiting hours are 7 a.m. to 8 p.m. Inpatients will be allowed two visitors daily. The visitors may change each day during the patient's stay. No visitors under the age of 59. Any visitor under the age of 105 must be accompanied by an adult. The visitor must pass COVID-19 screenings, use hand sanitizer when entering and exiting the patient's room and wear a mask at all times, including in the patient's room. Patients must also wear a mask when staff or their visitor are in the room. Masking is required regardless of vaccination status.

## 2020-12-26 NOTE — Progress Notes (Signed)
Preoperative History and Physical  Ashley Mooney is a 40 y.o. G1P0010 here for surgical management of menorrhagia with irregular cycle and uterine leiomyoma (unspecified location).   No significant preoperative concerns.  History of Present Illness: 40 y.o. G23P0010 female who presents with abnormal periods. She skipped two periods last year.  She had a normal (in timing) in May.  The period kept going after the normal eight days.  She took the medicine I had given her a few years ago.  She was trying to take Sprintec three a day (this made her sick). So, she took two pills per day.  She skipped one day to see how things would go. With this she bled and had a gigantic clot (size of a jumbo Crayola marker).  She also had bad cramps. Prior to this she had been having monthly periods, lasting 7-8 days.  This has been fine for about 10 years ago.   She would like to get a hysterectomy.  She has two days of pills remaining.   She has a noted history of a horseshoe kidney Her last ultrasound was in 2019 showing two small fibroids.  She has a history of myomectomy both laparoscopic and hysteroscopic.   She had an ultrasound at [facility] on 11/25/2020 that had the following report: FINDINGS:   UTERUS/CERVIX: The uterus is mildly enlarged in size due to fibroids. Multiple uterine fibroids, for reference:  - Anterior, fundus, hybrid, 2.5 x 3.2 x 3.1 cm  - Posterior, intramural, 2.6 x 1.8 x 2.1 cm.     The endometrium was not well visualized, due to uterine fibroids, however measuring up to 2.5 mm.   Uterus: Sagittal 10.6 cm; AP 5.9 cm; Transverse 6.4 cm   OVARIES: The ovaries were seen well transvaginally. Small cystic areas were seen within both ovaries compatible with follicles. Appropriate flow was documented on color Doppler imaging.   Right ovary: Sagittal 2.4 cm; AP 0.8 cm; Transverse 0.7 cm  Left ovary: Sagittal 2.2 cm; AP 1.1 cm; Transverse 1.3 cm   OTHER: No abnormal pelvic free fluid   Last pap  smear 01/01/2018: NILM, HPV negative  Proposed surgery: robot assisted Total Laparoscopic Hysterectomy, bilateral salpingectomy, with cystectomy  Past Medical History:  Diagnosis Date   Anxiety    Depression    Endometriosis    Fibroid, uterine    Past Surgical History:  Procedure Laterality Date   DILATION AND CURETTAGE OF UTERUS  09/15/2014   miscarriage    DILATION AND CURETTAGE OF UTERUS  2014   Heavy bleeding    LAPAROSCOPIC GELPORT ASSISTED MYOMECTOMY  [id number]   MYOMECTOMY VAGINAL APPROACH  2014, 2015   OB History  Gravida Para Term Preterm AB Living  1 0 0 0 1 0  SAB IAB Ectopic Multiple Live Births  1       0    # Outcome Date GA Lbr Len/2nd Weight Sex Delivery Anes PTL Lv  1 SAB           Patient denies any other pertinent gynecologic issues.   Current Outpatient Medications on File Prior to Visit  Medication Sig Dispense Refill   APAP-Pamabrom-Pyrilamine (PAMPRIN MULTI-SYMPTOM) 500-25-15 MG TABS Take 1-2 tablets by mouth daily as needed (cramps).     bismuth subsalicylate (PEPTO BISMOL) 262 MG/15ML suspension Take 30 mLs by mouth every 6 (six) hours as needed for diarrhea or loose stools or indigestion.     calcium carbonate (TUMS - DOSED IN MG ELEMENTAL CALCIUM) 500 MG chewable  tablet Chew 500 mg by mouth daily as needed for indigestion or heartburn.     Iron, Ferrous Sulfate, 325 (65 Fe) MG TABS Take 325 mg by mouth 2 (two) times a week.     Melatonin 5 MG CAPS Take 5 mg by mouth at bedtime as needed (sleep).     norgestimate-ethinyl estradiol (SPRINT[place] 28) 0.25-35 MG-MCG tablet Take 1 tablet by mouth in the morning and at bedtime for 7 days. (Patient not taking: Reported on 12/22/2020) 56 tablet 1   No current facility-administered medications on file prior to visit.   Allergies  Allergen Reactions   Oxycodone Rash   Zoloft [Sertraline Hcl] Rash    Social History:   reports that she has been smoking. She has never used smokeless tobacco. She reports current  alcohol use. She reports that she does not use drugs.  Family History  Problem Relation Age of Onset   Lung cancer Maternal Grandfather    Colon cancer Paternal Grandfather     Review of Systems: Noncontributory  PHYSICAL EXAM: Blood pressure 122/70, height '5\' 7"'$  (1.702 m), weight 189 lb (85.7 kg). CONSTITUTIONAL: Well-developed, well-nourished female in no acute distress.  HENT:  Normocephalic, atraumatic, External right and left ear normal. Oropharynx is clear and moist EYES: Conjunctivae and EOM are normal. Pupils are equal, round, and reactive to light. No scleral icterus.  NECK: Normal range of motion, supple, no masses SKIN: Skin is warm and dry. No rash noted. Not diaphoretic. No erythema. No pallor. [place]: Alert and oriented to person, place, and time. Normal reflexes, muscle tone coordination. No cranial nerve deficit noted. PSYCHIATRIC: Normal mood and affect. Normal behavior. Normal judgment and thought content. CARDIOVASCULAR: Normal heart rate noted, regular rhythm RESPIRATORY: Effort and breath sounds normal, no problems with respiration noted ABDOMEN: Soft, nontender, nondistended. PELVIC: Deferred MUSCULOSKELETAL: Normal range of motion. No edema and no tenderness. 2+ distal pulses.  Labs: No results found for this or any previous visit (from the past 336 hour(s)).  Imaging Studies: No results found.  Assessment:   ICD-10-CM   1. Menorrhagia with irregular cycle  N92.1     2. Uterine leiomyoma, unspecified location  D25.9       Plan: Patient will undergo surgical management with the above surgery.   The risks of surgery were discussed in detail with the patient including but not limited to: bleeding which may require transfusion or reoperation; infection which may require antibiotics; injury to surrounding organs which may involve bowel, bladder, ureters ; need for additional procedures including laparoscopy or laparotomy; thromboembolic phenomenon, surgical  site problems and other postoperative/anesthesia complications. Likelihood of success in alleviating the patient's condition was discussed. Routine postoperative instructions will be reviewed with the patient and her family in detail after surgery.  The patient concurred with the proposed plan, giving informed written consent for the surgery. Preoperative prophylactic antibiotics, as indicated, and SCDs ordered on call to the OR.    She is allergic to oxycodone and does not do well (little response to hydrocodone). Discussed the use of dilaudid for post-op pain control.   Prentice Docker, MD 12/26/2020 8:15 AM

## 2020-12-26 NOTE — H&P (View-Only) (Signed)
Preoperative History and Physical  Ashley Mooney is a 40 y.o. G1P0010 here for surgical management of menorrhagia with irregular cycle and uterine leiomyoma (unspecified location).   No significant preoperative concerns.  History of Present Illness: 40 y.o. G40P0010 female who presents with abnormal periods. She skipped two periods last year.  She had a normal (in timing) in May.  The period kept going after the normal eight days.  She took the medicine I had given her a few years ago.  She was trying to take Sprintec three a day (this made her sick). So, she took two pills per day.  She skipped one day to see how things would go. With this she bled and had a gigantic clot (size of a jumbo [place] marker).  She also had bad cramps. Prior to this she had been having monthly periods, lasting 7-8 days.  This has been fine for about 10 years ago.   She would like to get a hysterectomy.  She has two days of pills remaining.   She has a noted history of a horseshoe kidney Her last ultrasound was in 2019 showing two small fibroids.  She has a history of myomectomy both laparoscopic and hysteroscopic.   She had an ultrasound at [facility] on 11/25/2020 that had the following report: FINDINGS:   UTERUS/CERVIX: The uterus is mildly enlarged in size due to fibroids. Multiple uterine fibroids, for reference:  - Anterior, fundus, hybrid, 2.5 x 3.2 x 3.1 cm  - Posterior, intramural, 2.6 x 1.8 x 2.1 cm.     The endometrium was not well visualized, due to uterine fibroids, however measuring up to 2.5 mm.   Uterus: Sagittal 10.6 cm; AP 5.9 cm; Transverse 6.4 cm   OVARIES: The ovaries were seen well transvaginally. Small cystic areas were seen within both ovaries compatible with follicles. Appropriate flow was documented on color Doppler imaging.   Right ovary: Sagittal 2.4 cm; AP 0.8 cm; Transverse 0.7 cm  Left ovary: Sagittal 2.2 cm; AP 1.1 cm; Transverse 1.3 cm   OTHER: No abnormal pelvic free fluid   Last pap  smear 01/01/2018: NILM, HPV negative  Proposed surgery: robot assisted Total Laparoscopic Hysterectomy, bilateral salpingectomy, with cystectomy  Past Medical History:  Diagnosis Date   Anxiety    Depression    Endometriosis    Fibroid, uterine    Past Surgical History:  Procedure Laterality Date   DILATION AND CURETTAGE OF UTERUS  09/15/2014   miscarriage    DILATION AND CURETTAGE OF UTERUS  2014   Heavy bleeding    LAPAROSCOPIC [place]ELPORT ASSISTED MYOMECTOMY  [id number]   MYOMECTOMY VAGINAL APPROACH  2014, 2015   OB History  Gravida Para Term Preterm AB Living  1 0 0 0 1 0  SAB IAB Ectopic Multiple Live Births  1       0    # Outcome Date GA Lbr Len/2nd Weight Sex Delivery Anes PTL Lv  1 SAB           Patient denies any other pertinent gynecologic issues.   Current Outpatient Medications on File Prior to Visit  Medication Sig Dispense Refill   APAP-Pamabrom-Pyrilamine (PAMPRIN MULTI-SYMPTOM) 500-25-15 MG TABS Take 1-2 tablets by mouth daily as needed (cramps).     bismuth subsalicylate (PEPTO BISMOL) 262 MG/15ML suspension Take 30 mLs by mouth every 6 (six) hours as needed for diarrhea or loose stools or indigestion.     calcium carbonate (TUMS - DOSED IN MG ELEMENTAL CALCIUM) 500 MG chewable  tablet Chew 500 mg by mouth daily as needed for indigestion or heartburn.     Iron, Ferrous Sulfate, 325 (65 Fe) MG TABS Take 325 mg by mouth 2 (two) times a week.     Melatonin 5 MG CAPS Take 5 mg by mouth at bedtime as needed (sleep).     norgestimate-ethinyl estradiol (SPRINTEC 28) 0.25-35 MG-MCG tablet Take 1 tablet by mouth in the morning and at bedtime for 7 days. (Patient not taking: Reported on 12/22/2020) 56 tablet 1   No current facility-administered medications on file prior to visit.   Allergies  Allergen Reactions   Oxycodone Rash   Zoloft [Sertraline Hcl] Rash    Social History:   reports that she has been smoking. She has never used smokeless tobacco. She reports current  alcohol use. She reports that she does not use drugs.  Family History  Problem Relation Age of Onset   Lung cancer Maternal Grandfather    Colon cancer Paternal Grandfather     Review of Systems: Noncontributory  PHYSICAL EXAM: Blood pressure 122/70, height '5\' 7"'$  (1.702 m), weight 189 lb (85.7 kg). CONSTITUTIONAL: Well-developed, well-nourished female in no acute distress.  HENT:  Normocephalic, atraumatic, External right and left ear normal. Oropharynx is clear and moist EYES: Conjunctivae and EOM are normal. Pupils are equal, round, and reactive to light. No scleral icterus.  NECK: Normal range of motion, supple, no masses SKIN: Skin is warm and dry. No rash noted. Not diaphoretic. No erythema. No pallor. [place]: Alert and oriented to person, place, and time. Normal reflexes, muscle tone coordination. No cranial nerve deficit noted. PSYCHIATRIC: Normal mood and affect. Normal behavior. Normal judgment and thought content. CARDIOVASCULAR: Normal heart rate noted, regular rhythm RESPIRATORY: Effort and breath sounds normal, no problems with respiration noted ABDOMEN: Soft, nontender, nondistended. PELVIC: Deferred MUSCULOSKELETAL: Normal range of motion. No edema and no tenderness. 2+ distal pulses.  Labs: No results found for this or any previous visit (from the past 336 hour(s)).  Imaging Studies: No results found.  Assessment:   ICD-10-CM   1. Menorrhagia with irregular cycle  N92.1     2. Uterine leiomyoma, unspecified location  D25.9       Plan: Patient will undergo surgical management with the above surgery.   The risks of surgery were discussed in detail with the patient including but not limited to: bleeding which may require transfusion or reoperation; infection which may require antibiotics; injury to surrounding organs which may involve bowel, bladder, ureters ; need for additional procedures including laparoscopy or laparotomy; thromboembolic phenomenon, surgical  site problems and other postoperative/anesthesia complications. Likelihood of success in alleviating the patient's condition was discussed. Routine postoperative instructions will be reviewed with the patient and her family in detail after surgery.  The patient concurred with the proposed plan, giving informed written consent for the surgery. Preoperative prophylactic antibiotics, as indicated, and SCDs ordered on call to the OR.    She is allergic to oxycodone and does not do well (little response to hydrocodone). Discussed the use of dilaudid for post-op pain control.   Prentice Docker, MD 12/26/2020 8:15 AM

## 2021-01-03 MED ORDER — POVIDONE-IODINE 10 % EX SWAB
2.0000 "application " | Freq: Once | CUTANEOUS | Status: AC
  Administered 2021-01-04: 2 via TOPICAL

## 2021-01-03 MED ORDER — FAMOTIDINE 20 MG PO TABS: 20.0000 mg | ORAL_TABLET | Freq: Once | ORAL | Status: AC

## 2021-01-03 MED ORDER — APREPITANT 40 MG PO CAPS
40.0000 mg | ORAL_CAPSULE | Freq: Once | ORAL | Status: AC
Start: 2021-01-03 — End: 2021-01-04

## 2021-01-03 MED ORDER — LACTATED RINGERS IV SOLN: INTRAVENOUS | Status: DC

## 2021-01-03 MED ORDER — CEFAZOLIN SODIUM-DEXTROSE 2-4 GM/100ML-% IV SOLN
2.0000 g | INTRAVENOUS | Status: AC
  Administered 2021-01-04: 2 g via INTRAVENOUS

## 2021-01-04 ENCOUNTER — Encounter: Payer: Self-pay | Admitting: Obstetrics and Gynecology

## 2021-01-04 ENCOUNTER — Ambulatory Visit
Admission: RE | Admit: 2021-01-04 | Discharge: 2021-01-04 | Disposition: A | Discharge status: Home / Self Care | Payer: BC Managed Care – PPO | Attending: Obstetrics and Gynecology | Admitting: Obstetrics and Gynecology

## 2021-01-04 ENCOUNTER — Ambulatory Visit: Payer: BC Managed Care – PPO | Admitting: Registered Nurse

## 2021-01-04 ENCOUNTER — Encounter
Admission: RE | Disposition: A | Discharge status: Home / Self Care | Payer: Self-pay | Source: Home / Self Care | Attending: Obstetrics and Gynecology

## 2021-01-04 ENCOUNTER — Other Ambulatory Visit: Payer: Self-pay

## 2021-01-04 DIAGNOSIS — N921 Excessive and frequent menstruation with irregular cycle: Secondary | ICD-10-CM

## 2021-01-04 DIAGNOSIS — N736 Female pelvic peritoneal adhesions (postinfective): Secondary | ICD-10-CM

## 2021-01-04 DIAGNOSIS — N920 Excessive and frequent menstruation with regular cycle: Secondary | ICD-10-CM | POA: Diagnosis not present

## 2021-01-04 DIAGNOSIS — F17210 Nicotine dependence, cigarettes, uncomplicated: Secondary | ICD-10-CM | POA: Diagnosis not present

## 2021-01-04 DIAGNOSIS — Q631 Lobulated, fused and horseshoe kidney: Secondary | ICD-10-CM | POA: Diagnosis not present

## 2021-01-04 DIAGNOSIS — D252 Subserosal leiomyoma of uterus: Secondary | ICD-10-CM | POA: Insufficient documentation

## 2021-01-04 DIAGNOSIS — D259 Leiomyoma of uterus, unspecified: Secondary | ICD-10-CM | POA: Diagnosis present

## 2021-01-04 DIAGNOSIS — N72 Inflammatory disease of cervix uteri: Secondary | ICD-10-CM | POA: Insufficient documentation

## 2021-01-04 DIAGNOSIS — D251 Intramural leiomyoma of uterus: Secondary | ICD-10-CM | POA: Diagnosis not present

## 2021-01-04 DIAGNOSIS — Z90710 Acquired absence of both cervix and uterus: Secondary | ICD-10-CM

## 2021-01-04 DIAGNOSIS — Z885 Allergy status to narcotic agent status: Secondary | ICD-10-CM | POA: Diagnosis not present

## 2021-01-04 HISTORY — PX: CYSTOSCOPY: SHX5120

## 2021-01-04 HISTORY — PX: ROBOTIC ASSISTED TOTAL HYSTERECTOMY WITH BILATERAL SALPINGO OOPHERECTOMY: SHX6086

## 2021-01-04 LAB — POCT PREGNANCY, URINE: Preg Test, Ur: NEGATIVE

## 2021-01-04 LAB — ABO/RH: ABO/RH(D): B POS

## 2021-01-04 SURGERY — HYSTERECTOMY, TOTAL, ROBOT-ASSISTED, LAPAROSCOPIC, WITH BILATERAL SALPINGO-OOPHORECTOMY
Anesthesia: General

## 2021-01-04 MED ORDER — PROPOFOL 500 MG/50ML IV EMUL
INTRAVENOUS | Status: AC
  Filled 2021-01-04: qty 50

## 2021-01-04 MED ORDER — DEXMEDETOMIDINE HCL IN NACL 200 MCG/50ML IV SOLN
INTRAVENOUS | Status: DC | PRN
  Administered 2021-01-04: 12 ug via INTRAVENOUS
  Administered 2021-01-04: 10 ug via INTRAVENOUS
  Administered 2021-01-04: 8 ug via INTRAVENOUS
  Administered 2021-01-04 (×3): 10 ug via INTRAVENOUS

## 2021-01-04 MED ORDER — DEXAMETHASONE SODIUM PHOSPHATE 10 MG/ML IJ SOLN
INTRAMUSCULAR | Status: DC | PRN
  Administered 2021-01-04: 5 mg via INTRAVENOUS

## 2021-01-04 MED ORDER — LIDOCAINE HCL (CARDIAC) PF 100 MG/5ML IV SOSY
PREFILLED_SYRINGE | INTRAVENOUS | Status: DC | PRN
  Administered 2021-01-04: 100 mg via INTRAVENOUS

## 2021-01-04 MED ORDER — ONDANSETRON HCL 4 MG/2ML IJ SOLN
INTRAMUSCULAR | Status: AC
  Filled 2021-01-04: qty 2

## 2021-01-04 MED ORDER — MIDAZOLAM HCL 2 MG/2ML IJ SOLN
INTRAMUSCULAR | Status: DC | PRN
  Administered 2021-01-04: 2 mg via INTRAVENOUS

## 2021-01-04 MED ORDER — ONDANSETRON 4 MG PO TBDP
4.0000 mg | ORAL_TABLET | Freq: Three times a day (TID) | ORAL | 0 refills | Status: DC | PRN
Start: 2021-01-04 — End: 2021-05-04

## 2021-01-04 MED ORDER — PROMETHAZINE HCL 25 MG/ML IJ SOLN: 6.2500 mg | INTRAMUSCULAR | Status: DC | PRN

## 2021-01-04 MED ORDER — FAMOTIDINE 20 MG PO TABS
ORAL_TABLET | ORAL | Status: AC
  Administered 2021-01-04: 20 mg via ORAL
  Filled 2021-01-04: qty 1

## 2021-01-04 MED ORDER — ACETAMINOPHEN 10 MG/ML IV SOLN
INTRAVENOUS | Status: AC
  Filled 2021-01-04: qty 100

## 2021-01-04 MED ORDER — KETAMINE HCL 50 MG/5ML IJ SOSY
PREFILLED_SYRINGE | INTRAMUSCULAR | Status: AC
  Filled 2021-01-04: qty 5

## 2021-01-04 MED ORDER — KETAMINE HCL 10 MG/ML IJ SOLN
INTRAMUSCULAR | Status: DC | PRN
  Administered 2021-01-04 (×3): 10 mg via INTRAVENOUS
  Administered 2021-01-04 (×2): 20 mg via INTRAVENOUS

## 2021-01-04 MED ORDER — BUPIVACAINE HCL (PF) 0.5 % IJ SOLN
INTRAMUSCULAR | Status: DC | PRN
  Administered 2021-01-04: 15 mL

## 2021-01-04 MED ORDER — IBUPROFEN 600 MG PO TABS: 600.0000 mg | ORAL_TABLET | Freq: Four times a day (QID) | ORAL | 0 refills | Status: DC

## 2021-01-04 MED ORDER — 0.9 % SODIUM CHLORIDE (POUR BTL) OPTIME
TOPICAL | Status: DC | PRN
  Administered 2021-01-04: 200 mL

## 2021-01-04 MED ORDER — DEXAMETHASONE SODIUM PHOSPHATE 10 MG/ML IJ SOLN
INTRAMUSCULAR | Status: AC
  Filled 2021-01-04: qty 1

## 2021-01-04 MED ORDER — FENTANYL CITRATE (PF) 100 MCG/2ML IJ SOLN
INTRAMUSCULAR | Status: AC
  Administered 2021-01-04: 25 ug via INTRAVENOUS
  Filled 2021-01-04: qty 2

## 2021-01-04 MED ORDER — PROPOFOL 10 MG/ML IV BOLUS
INTRAVENOUS | Status: DC | PRN
  Administered 2021-01-04 (×2): 20 mg via INTRAVENOUS
  Administered 2021-01-04: 180 mg via INTRAVENOUS

## 2021-01-04 MED ORDER — ONDANSETRON HCL 4 MG/2ML IJ SOLN
INTRAMUSCULAR | Status: DC | PRN
  Administered 2021-01-04: 4 mg via INTRAVENOUS

## 2021-01-04 MED ORDER — ORAL CARE MOUTH RINSE: 15.0000 mL | Freq: Once | OROMUCOSAL | Status: AC

## 2021-01-04 MED ORDER — PROPOFOL 500 MG/50ML IV EMUL
INTRAVENOUS | Status: DC | PRN
  Administered 2021-01-04: 150 ug/kg/min via INTRAVENOUS

## 2021-01-04 MED ORDER — SODIUM CHLORIDE 0.9 % IR SOLN
Status: DC | PRN
  Administered 2021-01-04: 100 mL
  Administered 2021-01-04: 1000 mL

## 2021-01-04 MED ORDER — APREPITANT 40 MG PO CAPS
ORAL_CAPSULE | ORAL | Status: AC
  Administered 2021-01-04: 40 mg via ORAL
  Filled 2021-01-04: qty 1

## 2021-01-04 MED ORDER — KETOROLAC TROMETHAMINE 30 MG/ML IJ SOLN
INTRAMUSCULAR | Status: AC
  Filled 2021-01-04: qty 1

## 2021-01-04 MED ORDER — FENTANYL CITRATE (PF) 100 MCG/2ML IJ SOLN
INTRAMUSCULAR | Status: DC | PRN
  Administered 2021-01-04: 25 ug via INTRAVENOUS
  Administered 2021-01-04: 50 ug via INTRAVENOUS
  Administered 2021-01-04: 25 ug via INTRAVENOUS

## 2021-01-04 MED ORDER — BUPIVACAINE HCL (PF) 0.5 % IJ SOLN
INTRAMUSCULAR | Status: AC
  Filled 2021-01-04: qty 30

## 2021-01-04 MED ORDER — FENTANYL CITRATE (PF) 100 MCG/2ML IJ SOLN
INTRAMUSCULAR | Status: AC
  Filled 2021-01-04: qty 2

## 2021-01-04 MED ORDER — KETOROLAC TROMETHAMINE 30 MG/ML IJ SOLN
INTRAMUSCULAR | Status: DC | PRN
  Administered 2021-01-04: 30 mg via INTRAVENOUS

## 2021-01-04 MED ORDER — FLUORESCEIN SODIUM 10 % IV SOLN
INTRAVENOUS | Status: DC | PRN
  Administered 2021-01-04: 50 mg via INTRAVENOUS

## 2021-01-04 MED ORDER — ROCURONIUM BROMIDE 10 MG/ML (PF) SYRINGE
PREFILLED_SYRINGE | INTRAVENOUS | Status: AC
  Filled 2021-01-04: qty 10

## 2021-01-04 MED ORDER — PROPOFOL 10 MG/ML IV BOLUS
INTRAVENOUS | Status: AC
  Filled 2021-01-04: qty 20

## 2021-01-04 MED ORDER — FENTANYL CITRATE (PF) 100 MCG/2ML IJ SOLN
25.0000 ug | INTRAMUSCULAR | Status: DC | PRN
  Administered 2021-01-04 (×4): 25 ug via INTRAVENOUS

## 2021-01-04 MED ORDER — ACETAMINOPHEN 10 MG/ML IV SOLN
INTRAVENOUS | Status: DC | PRN
  Administered 2021-01-04: 1000 mg via INTRAVENOUS

## 2021-01-04 MED ORDER — FLUORESCEIN SODIUM 10 % IV SOLN
INTRAVENOUS | Status: AC
  Filled 2021-01-04: qty 5

## 2021-01-04 MED ORDER — SUGAMMADEX SODIUM 200 MG/2ML IV SOLN
INTRAVENOUS | Status: DC | PRN
  Administered 2021-01-04: 200 mg via INTRAVENOUS

## 2021-01-04 MED ORDER — PROMETHAZINE HCL 25 MG/ML IJ SOLN
INTRAMUSCULAR | Status: AC
  Administered 2021-01-04: 6.25 mg via INTRAVENOUS
  Filled 2021-01-04: qty 1

## 2021-01-04 MED ORDER — CHLORHEXIDINE GLUCONATE 0.12 % MT SOLN
15.0000 mL | Freq: Once | OROMUCOSAL | Status: AC
Start: 2021-01-04 — End: 2021-01-04

## 2021-01-04 MED ORDER — HYDROMORPHONE HCL 2 MG PO TABS: 2.0000 mg | ORAL_TABLET | Freq: Four times a day (QID) | ORAL | 0 refills | Status: AC | PRN

## 2021-01-04 MED ORDER — PHENYLEPHRINE HCL (PRESSORS) 10 MG/ML IV SOLN
INTRAVENOUS | Status: AC
  Filled 2021-01-04: qty 1

## 2021-01-04 MED ORDER — CHLORHEXIDINE GLUCONATE 0.12 % MT SOLN
OROMUCOSAL | Status: AC
  Administered 2021-01-04: 15 mL via OROMUCOSAL
  Filled 2021-01-04: qty 15

## 2021-01-04 MED ORDER — MIDAZOLAM HCL 2 MG/2ML IJ SOLN
INTRAMUSCULAR | Status: AC
  Filled 2021-01-04: qty 2

## 2021-01-04 MED ORDER — ROCURONIUM BROMIDE 100 MG/10ML IV SOLN
INTRAVENOUS | Status: DC | PRN
  Administered 2021-01-04: 60 mg via INTRAVENOUS
  Administered 2021-01-04 (×2): 20 mg via INTRAVENOUS

## 2021-01-04 SURGICAL SUPPLY — 64 items
"PENCIL ELECTRO HAND CTR " (MISCELLANEOUS) ×2 IMPLANT
BAG URINE DRAIN 2000ML AR STRL (UROLOGICAL SUPPLIES) ×3 IMPLANT
BLADE SURG SZ11 CARB STEEL (BLADE) ×3 IMPLANT
CANNULA CAP OBTURATR AIRSEAL 8 (CAP) ×1 IMPLANT
CATH FOLEY 2WAY  5CC 16FR (CATHETERS) ×1
CATH URTH 16FR FL 2W BLN LF (CATHETERS) ×2 IMPLANT
COVER TIP SHEARS 8 DVNC (MISCELLANEOUS) ×2 IMPLANT
COVER TIP SHEARS 8MM DA VINCI (MISCELLANEOUS) ×1
DERMABOND ADVANCED (GAUZE/BANDAGES/DRESSINGS) ×1
DERMABOND ADVANCED .7 DNX12 (GAUZE/BANDAGES/DRESSINGS) ×2 IMPLANT
DRAPE ARM DVNC X/XI (DISPOSABLE) ×8 IMPLANT
DRAPE COLUMN DVNC XI (DISPOSABLE) ×2 IMPLANT
DRAPE DA VINCI XI ARM (DISPOSABLE) ×4
DRAPE DA VINCI XI COLUMN (DISPOSABLE) ×1
DRAPE UNDER BUTTOCK W/FLU (DRAPES) ×3 IMPLANT
ELECT REM PT RETURN 9FT ADLT (ELECTROSURGICAL) ×3
ELECTRODE REM PT RTRN 9FT ADLT (ELECTROSURGICAL) ×2 IMPLANT
GAUZE 4X4 16PLY ~~LOC~~+RFID DBL (SPONGE) ×6 IMPLANT
GLOVE SURG ENC MOIS LTX SZ6.5 (GLOVE) ×18 IMPLANT
GLOVE SURG ENC MOIS LTX SZ7 (GLOVE) ×3 IMPLANT
GLOVE SURG UNDER POLY LF SZ6.5 (GLOVE) ×12 IMPLANT
GLOVE SURG UNDER POLY LF SZ7.5 (GLOVE) ×3 IMPLANT
GOWN STRL REUS W/ TWL LRG LVL3 (GOWN DISPOSABLE) ×16 IMPLANT
GOWN STRL REUS W/ TWL XL LVL3 (GOWN DISPOSABLE) ×2 IMPLANT
GOWN STRL REUS W/TWL LRG LVL3 (GOWN DISPOSABLE) ×8
GOWN STRL REUS W/TWL XL LVL3 (GOWN DISPOSABLE) ×1
IRRIGATION STRYKERFLOW (MISCELLANEOUS) IMPLANT
IRRIGATOR STRYKERFLOW (MISCELLANEOUS) ×3
IV NS 1000ML (IV SOLUTION) ×2
IV NS 1000ML BAXH (IV SOLUTION) IMPLANT
KIT PINK PAD W/HEAD ARE REST (MISCELLANEOUS) ×3
KIT PINK PAD W/HEAD ARM REST (MISCELLANEOUS) ×2 IMPLANT
LABEL OR SOLS (LABEL) ×3 IMPLANT
MANIFOLD NEPTUNE II (INSTRUMENTS) ×3 IMPLANT
MANIPULATOR VCARE LG CRV RETR (MISCELLANEOUS) ×1 IMPLANT
MANIPULATOR VCARE SML CRV RETR (MISCELLANEOUS) IMPLANT
NEEDLE HYPO 22GX1.5 SAFETY (NEEDLE) ×3 IMPLANT
NEEDLE VERESS 14GA 120MM (NEEDLE) ×3 IMPLANT
NS IRRIG 1000ML POUR BTL (IV SOLUTION) ×3 IMPLANT
OCCLUDER COLPOPNEUMO (BALLOONS) ×3 IMPLANT
PACK GYN LAPAROSCOPIC (MISCELLANEOUS) ×3 IMPLANT
PAD OB MATERNITY 4.3X12.25 (PERSONAL CARE ITEMS) ×3 IMPLANT
PAD PREP 24X41 OB/GYN DISP (PERSONAL CARE ITEMS) ×3 IMPLANT
PENCIL ELECTRO HAND CTR (MISCELLANEOUS) ×3 IMPLANT
SCRUB EXIDINE 4% CHG 4OZ (MISCELLANEOUS) ×3 IMPLANT
SEAL CANN UNIV 5-8 DVNC XI (MISCELLANEOUS) ×6 IMPLANT
SEAL XI 5MM-8MM UNIVERSAL (MISCELLANEOUS) ×3
SEALER VESSEL DA VINCI XI (MISCELLANEOUS) ×1
SEALER VESSEL EXT DVNC XI (MISCELLANEOUS) IMPLANT
SET CYSTO W/LG BORE CLAMP LF (SET/KITS/TRAYS/PACK) ×1 IMPLANT
SET TUBE FILTERED XL AIRSEAL (SET/KITS/TRAYS/PACK) ×1 IMPLANT
SOLUTION ELECTROLUBE (MISCELLANEOUS) ×3 IMPLANT
SURGILUBE 2OZ TUBE FLIPTOP (MISCELLANEOUS) ×3 IMPLANT
SUT MNCRL 4-0 (SUTURE) ×1
SUT MNCRL 4-0 27XMFL (SUTURE) ×2
SUT STRATAFIX 0 PDS+ CT-2 23 (SUTURE) ×3
SUT VIC AB 0 CT1 36 (SUTURE) ×3 IMPLANT
SUT VICRYL 0 UR6 27IN ABS (SUTURE) ×3 IMPLANT
SUTURE MNCRL 4-0 27XMF (SUTURE) ×2 IMPLANT
SUTURE STRATFX 0 PDS+ CT-2 23 (SUTURE) IMPLANT
SYR 10ML LL (SYRINGE) ×3 IMPLANT
SYR 50ML LL SCALE MARK (SYRINGE) ×3 IMPLANT
TROCAR XCEL NON-BLD 5MMX100MML (ENDOMECHANICALS) ×1 IMPLANT
WATER STERILE IRR 500ML POUR (IV SOLUTION) ×3 IMPLANT

## 2021-01-04 NOTE — Anesthesia Procedure Notes (Signed)
Procedure Name: Intubation Date/Time: 01/04/2021 7:49 AM Performed by: Lia Foyer, CRNA Pre-anesthesia Checklist: Patient identified, Emergency Drugs available, Suction available and Patient being monitored Patient Re-evaluated:Patient Re-evaluated prior to induction Oxygen Delivery Method: Circle system utilized Preoxygenation: Pre-oxygenation with 100% oxygen Induction Type: IV induction Ventilation: Mask ventilation without difficulty Laryngoscope Size: Mac and 3 Grade View: Grade I Tube type: Oral Tube size: 7.0 mm Number of attempts: 1 Airway Equipment and Method: Stylet and Oral airway Placement Confirmation: ETT inserted through vocal cords under direct vision, positive ETCO2 and breath sounds checked- equal and bilateral Secured at: 22 cm Tube secured with: Tape Dental Injury: Teeth and Oropharynx as per pre-operative assessment

## 2021-01-04 NOTE — Interval H&P Note (Signed)
History and Physical Interval Note:  01/04/2021 7:25 AM  Ashley Mooney  has presented today for surgery, with the diagnosis of menorrhagia with regular cycle N92.0 uterine meiomyome, unspecified location N25.9.  The various methods of treatment have been discussed with the patient and family. After consideration of risks, benefits and other options for treatment, the patient has consented to  Procedure(s): XI ROBOTIC ASSISTED TOTAL HYSTERECTOMY WITH BILATERAL SALPINGectomy (Bilateral) CYSTOSCOPY (N/A) as a surgical intervention.  The patient's history has been reviewed, patient examined, no change in status, stable for surgery.  I have reviewed the patient's chart and labs.  Questions were answered to the patient's satisfaction.  She is 100% sure she would like to proceed and was given the opportunity to change her mind again without anyone being upset with her. She voiced that she is ready to proceed.     Prentice Docker, MD, [facility], [facility] 01/04/2021 7:25 AM

## 2021-01-04 NOTE — Anesthesia Postprocedure Evaluation (Signed)
Anesthesia Post Note  Patient: Ashley Mooney  Procedure(s) Performed: XI ROBOTIC ASSISTED TOTAL HYSTERECTOMY WITH BILATERAL SALPINGectomy (Bilateral) CYSTOSCOPY  Patient location during evaluation: PACU Anesthesia Type: General Level of consciousness: awake and alert Pain management: pain level controlled Vital Signs Assessment: post-procedure vital signs reviewed and stable Respiratory status: spontaneous breathing, nonlabored ventilation, respiratory function stable and patient connected to nasal cannula oxygen Cardiovascular status: blood pressure returned to baseline and stable Postop Assessment: no apparent nausea or vomiting Anesthetic complications: no   No notable events documented.   Last Vitals:  Vitals:   01/04/21 1233 01/04/21 1240  BP: 91/68 100/72  Pulse: 64 71  Resp: 13 18  Temp: (!) 36.1 C (!) 36.1 C  SpO2: 100% 100%    Last Pain:  Vitals:   01/04/21 1240  TempSrc: Temporal  PainSc: 0-No pain                 Martha Clan

## 2021-01-04 NOTE — Discharge Instructions (Signed)

## 2021-01-04 NOTE — Op Note (Signed)
Operative Note    Name: Ashley Mooney  Date of Service: 01/04/2021  DOB: 1980-12-06  MRN: [id number]   Pre-Operative Diagnosis:  1) Menorrhagia with irregular cycle [N92.1] 2) Fibroid Uterus [D25.9]  Post-Operative Diagnosis:  1) Menorrhagia with irregular cycle [N92.1] 2) Fibroid Uterus [D25.9]  Procedures:  1) Robot assisted Total Laparoscopic Hysterectomy, bilateral salpingectomy  2) Cystoscopy  Primary Surgeon: Prentice Docker, MD   Assistant Surgeon: Santiago Glad, MD - No other capable assistant available, in surgery requiring high level assistant.  EBL: 75 mL   IVF: 1,000 mL   Urine output: 450 mL clear urine at end of procedure  Specimens: Uterus with cervix, bilateral fallopian tubes  Drains: none  Complications: None   Disposition: PACU   Condition: Stable   Findings:  1) Enlarged uterus with an anterior fibroid and a right antero-lateral fibroid, normal appearing fallopian tubes 2) Normal appearing right ovary, left ovary normal appearing with a small (~1.5 cm simple appearing cyst) 3) on cystoscopy, no evidence of bladder wall damage and there was efflux of urine from the bilateral ureteral orifices  Procedure Summary:  The patient was taken to the operating room where general anesthesia was administered and found to be adequate. She was placed in the dorsal supine lithotomy position in [place] stirrups and prepped and draped in the usual sterile fashion. After a timeout was called an indwelling catheter was placed in her bladder.    Attention was turned to the abdomen where after injection of local anesthetic, an 8 mm supraumbilical incision was made with the scalpel. Entry into the abdomen was obtained with direct entry. After entering the abdomen, an 8 mm Xi obturator was placed with a Christmas Tree device on the bottom to prevent loss of pneumoperitoneum.  The camera was introduced through the trocar with verification of atraumatic entry.  Right and  left abdominal entry sites were created after injection of local anesthetic about 8 cm lateral to the umbilical port in accordance with the Intuitive manufacturer's recommendations.  An additional 5 mm assist port was placed 8 cm lateral and inferior to the right abdominal port with verification of clearance above the iliac crest by more than 2 cm.  The port sites were 8 mm apart from the assist port.  The intuitive trochars were introduced under intra-abdominal camera visualization without difficulty  A sterile speculum was placed in her vagina.  The anterior lip of the cervix was grasped with the single-tooth tenaculum.  The cervix was serially dilated to an 11 Pratt dilator.  The large Vcare device was placed in accordance to the manufacturer's recommendations while directly visualizing from the intra-abdominal space.  The speculum was removed.    The XI robot was docked on the patient's left.  Clearance was verified from the patient's legs.  Through the supra-umbilical port the camera was placed.  Through the port attached to arm 4 the monopolar scissors were placed.  Through the port attached to arm 2 the vessel sealer was attached.   After inspection of the abdomen and pelvis with the above-noted findings, the bilateral ureters were identified and found to be well away from the operative area of interest. The left fallopian tube was grasped at the fimbriated end and was transected using the vessel sealer along the mesosalpinx in a lateral to medial fashion. The vessel sealer was used to transect the left round ligament and the utero-ovarian ligament was transected. Tissue was divided along the left broad ligament to the level of the interior  cervical os. Bladder tissue were dissected off the lower uterine segment and cervix without difficulty. The left uterine artery was skeletonized and identified and after ligation was transected with the Vessel Sealer device. The same procedure was carried out on the  right side. The colpotomy was performed using monopolar electrocautery in a circumferential fashion following the KOH ring.  The uterus and fallopian tubes and cervix were removed through the vagina.   Closure of the vaginal cuff was undertaken using the braided, self locking stitch in a running fashion. All vascular pedicles were inspected and found to be hemostatic.  All instruments removed from the robotic ports.  The robot was undocked from the patient.  The abdomen was then desufflated of CO2.  All trochars were then removed.  The supra-umbilical fascia was closed with 0 vicryl.  All skin incisions were closed using 4-0 Vicryl in a subcuticular fashion and reinforced using surgical skin glue.   Cystoscopy was undertaken at this point. The Foley catheter was removed and the 70 cystoscope was gently introduced through the urethra. The bladder survey was undertaken with efflux of urine from both orifices noted. There were no defects noted in the bladder wall. The cystoscope was removed and the Foley catheter was utilized to fully empty the bladder. The catheter was removed.  The assistant surgeon helped with retraction, dissection of the bladder and performed the colpotomy.    The patient tolerated the procedure well.  Sponge, lap, needle, and instrument counts were correct x 2.  VTE prophylaxis: SCDs. Antibiotic prophylaxis: Ancef 2 grams IV. She was awakened in the operating room and was taken to the PACU in stable condition. The assistant surgeon was an MD due to lack of availability of another Counselling psychologist.   Prentice Docker, MD 01/04/2021 11:04 AM

## 2021-01-04 NOTE — Transfer of Care (Signed)
Immediate Anesthesia Transfer of Care Note  Patient: Ashley Mooney  Procedure(s) Performed: XI ROBOTIC ASSISTED TOTAL HYSTERECTOMY WITH BILATERAL SALPINGectomy (Bilateral) CYSTOSCOPY  Patient Location: PACU  Anesthesia Type:General  Level of Consciousness: drowsy and patient cooperative  Airway & Oxygen Therapy: Patient Spontanous Breathing  Post-op Assessment: Report given to RN and Post -op Vital signs reviewed and stable  Post vital signs: Reviewed and stable  Last Vitals: see Flowsheets Vitals Value Taken Time  BP    Temp    Pulse    Resp    SpO2      Last Pain:  Vitals:   01/04/21 0620  TempSrc: Temporal  PainSc: 0-No pain         Complications: No notable events documented.

## 2021-01-04 NOTE — Anesthesia Preprocedure Evaluation (Signed)
Anesthesia Evaluation  Patient identified by MRN, date of birth, ID band Patient awake    Reviewed: Allergy & Precautions, H&P , NPO status , Patient's Chart, lab work & pertinent test results, reviewed documented beta blocker date and time   History of Anesthesia Complications (+) PONV and history of anesthetic complications  Airway Mallampati: II  TM Distance: >3 FB Neck ROM: full    Dental  (+) Dental Advidsory Given, Teeth Intact   Pulmonary neg pulmonary ROS, former smoker,    Pulmonary exam normal breath sounds clear to auscultation       Cardiovascular Exercise Tolerance: Good negative cardio ROS Normal cardiovascular exam Rhythm:regular Rate:Normal     Neuro/Psych PSYCHIATRIC DISORDERS Anxiety Depression negative neurological ROS     GI/Hepatic negative GI ROS, Neg liver ROS,   Endo/Other  negative endocrine ROS  Renal/GU Renal disease"horseshoe" kidney  negative genitourinary   Musculoskeletal   Abdominal   Peds  Hematology  (+) Blood dyscrasia, anemia ,   Anesthesia Other Findings Past Medical History: No date: Anemia No date: Anxiety No date: Depression No date: Endometriosis No date: Fibroid, uterine No date: PONV (postoperative nausea and vomiting)   Reproductive/Obstetrics negative OB ROS                             Anesthesia Physical Anesthesia Plan  ASA: 2  Anesthesia Plan: General   Post-op Pain Management:    Induction: Intravenous  PONV Risk Score and Plan: 4 or greater and Ondansetron, Dexamethasone, Midazolam, Aprepitant, Promethazine and Treatment may vary due to age or medical condition  Airway Management Planned: Oral ETT  Additional Equipment:   Intra-op Plan:   Post-operative Plan: Extubation in OR  Informed Consent: I have reviewed the patients History and Physical, chart, labs and discussed the procedure including the risks, benefits and  alternatives for the proposed anesthesia with the patient or authorized representative who has indicated his/her understanding and acceptance.     Dental Advisory Given  Plan Discussed with: Anesthesiologist, CRNA and Surgeon  Anesthesia Plan Comments:         Anesthesia Quick Evaluation

## 2021-01-06 ENCOUNTER — Telehealth: Payer: Self-pay

## 2021-01-06 LAB — SURGICAL PATHOLOGY

## 2021-01-06 NOTE — Telephone Encounter (Signed)
Tried to call pt to check on her after her surgery 8/31. Please let me know when she calls back

## 2021-01-06 NOTE — Telephone Encounter (Signed)
Patient doing better today from a pain standpoint. She is tolerating PO and voiding spontaneously and ambulating. No current issues to address. Will see in routine follow up. Pathology report briefly reviewed and was normal.

## 2021-01-12 ENCOUNTER — Ambulatory Visit (INDEPENDENT_AMBULATORY_CARE_PROVIDER_SITE_OTHER): Payer: BC Managed Care – PPO | Admitting: Obstetrics and Gynecology

## 2021-01-12 ENCOUNTER — Encounter: Payer: Self-pay | Admitting: Obstetrics and Gynecology

## 2021-01-12 ENCOUNTER — Other Ambulatory Visit: Payer: Self-pay

## 2021-01-12 VITALS — BP 118/79 | HR 94 | Ht 67.0 in | Wt 192.0 lb

## 2021-01-12 DIAGNOSIS — N921 Excessive and frequent menstruation with irregular cycle: Secondary | ICD-10-CM

## 2021-01-12 DIAGNOSIS — Z09 Encounter for follow-up examination after completed treatment for conditions other than malignant neoplasm: Secondary | ICD-10-CM

## 2021-01-12 DIAGNOSIS — D259 Leiomyoma of uterus, unspecified: Secondary | ICD-10-CM

## 2021-01-12 NOTE — Progress Notes (Signed)
   Postoperative Follow-up Patient presents post op from robot assisted Total Laparoscopic Hysterectomy, bilateral salpingectomy, cystoscopy 1 weeks ago for menorrhagia with irregular cycle, fibroid uterus.  Pathology Report: A. UTERUS WITH CERVIX AND BILATERAL FALLOPIAN TUBES; TOTAL HYSTERECTOMY  WITH BILATERAL SALPINGECTOMY:  - CERVIX WITH CHRONIC CERVICITIS AND SQUAMOUS METAPLASIA.  - MYOMETRIUM WITH SUBSEROSAL LEIOMYOMATA, LARGEST MEASURING 0.4 CM.  - PROLIFERATIVE ENDOMETRIUM.  - FIBROUS SEROSAL ADHESIONS.  - BILATERAL UNREMARKABLE FALLOPIAN TUBES WITH FIMBRIATED END.  - NEGATIVE FOR ATYPIA AND MALIGNANCY.   Subjective: Patient reports marked improvement in her preop symptoms. Eating a regular diet without difficulty. Pain: only requiring ibuprofen  Activity: increasing slowly.  She denies fever, chills, nausea, and vomiting.   Objective: Vital Signs: BP 118/79 (Cuff Size: Normal)   Pulse 94   Ht '5\' 7"'$  (1.702 m)   Wt 192 lb (87.1 kg)   LMP 11/23/2020   BMI 30.07 kg/m  Physical Exam Constitutional:      General: She is not in acute distress.    Appearance: Normal appearance.  HENT:     Head: Normocephalic and atraumatic.  Eyes:     General: No scleral icterus.    Conjunctiva/sclera: Conjunctivae normal.  Abdominal:     General: There is no distension.     Palpations: Abdomen is soft.     Tenderness: There is no abdominal tenderness. There is no guarding or rebound.     Comments: Incisions: without erythema, induration, warmth, and tenderness.  They are clean, dry, and intact.     Neurological:     General: No focal deficit present.     Mental Status: She is alert and oriented to person, place, and time.     Cranial Nerves: No cranial nerve deficit.  Psychiatric:        Mood and Affect: Mood normal.        Behavior: Behavior normal.        Judgment: Judgment normal.     Assessment: 40 y.o. s/p above surgery progressing well  Plan: Patient has done well after  surgery with no apparent complications.  I have discussed the post-operative course to date, and the expected progress moving forward.  The patient understands what complications to be concerned about.  I will see the patient in routine follow up, or sooner if needed.    Activity plan: increase slowly. No intercourse for 8-12 weeks.  Prentice Docker, MD  01/12/2021, 3:08 PM

## 2021-02-15 ENCOUNTER — Other Ambulatory Visit: Payer: Self-pay

## 2021-02-15 ENCOUNTER — Encounter: Payer: Self-pay | Admitting: Obstetrics and Gynecology

## 2021-02-15 ENCOUNTER — Ambulatory Visit (INDEPENDENT_AMBULATORY_CARE_PROVIDER_SITE_OTHER): Payer: BC Managed Care – PPO | Admitting: Obstetrics and Gynecology

## 2021-02-15 VITALS — BP 126/90 | Ht 67.0 in | Wt 190.0 lb

## 2021-02-15 DIAGNOSIS — Z09 Encounter for follow-up examination after completed treatment for conditions other than malignant neoplasm: Secondary | ICD-10-CM

## 2021-02-15 NOTE — Progress Notes (Signed)
   Postoperative Follow-up Patient presents post op from robot assisted Total Laparoscopic Hysterectomy, bilateral salpingectomy, cystoscopy 1 weeks ago for menorrhagia with irregular cycle, fibroid uterus.  Pathology Report: A. UTERUS WITH CERVIX AND BILATERAL FALLOPIAN TUBES; TOTAL HYSTERECTOMY  WITH BILATERAL SALPINGECTOMY:  - CERVIX WITH CHRONIC CERVICITIS AND SQUAMOUS METAPLASIA.  - MYOMETRIUM WITH SUBSEROSAL LEIOMYOMATA, LARGEST MEASURING 0.4 CM.  - PROLIFERATIVE ENDOMETRIUM.  - FIBROUS SEROSAL ADHESIONS.  - BILATERAL UNREMARKABLE FALLOPIAN TUBES WITH FIMBRIATED END.  - NEGATIVE FOR ATYPIA AND MALIGNANCY.   Subjective: Patient reports marked improvement in her preop symptoms. Eating a regular diet without difficulty. Pain: only requiring ibuprofen  Activity: increasing slowly.  She denies fever, chills, nausea, and vomiting.  She denies vaginal bleeding  Objective: Vital Signs: BP 126/90   Ht 5\' 7"  (1.702 m)   Wt 190 lb (86.2 kg)   LMP 11/23/2020   BMI 29.76 kg/m  Physical Exam Constitutional:      General: She is not in acute distress.    Appearance: Normal appearance.  Genitourinary:     Bladder and urethral meatus normal.     No lesions in the vagina.     Right Labia: No rash, tenderness, lesions, skin changes or Bartholin's cyst.    Left Labia: No tenderness, lesions, skin changes, Bartholin's cyst or rash.    Vaginal cuff intact.    Perineal sutures intact.    Vaginal granulation tissue (small ~2 x 2 mm centrally located. silver nitrate applied) present.     No vaginal discharge, tenderness, bleeding or cuff induration.     No vaginal prolapse present.    No vaginal atrophy present.     Right Adnexa: not tender, not full and no mass present.    Left Adnexa: not tender, not full and no mass present.    Cervix is absent.     Uterus is absent.     Pelvic exam was performed with patient in the lithotomy position.  HENT:     Head: Normocephalic and atraumatic.   Eyes:     General: No scleral icterus.    Conjunctiva/sclera: Conjunctivae normal.  Abdominal:     General: There is no distension.     Palpations: Abdomen is soft.     Tenderness: There is no abdominal tenderness. There is no guarding or rebound.     Comments: Incisions: without erythema, induration, warmth, and tenderness.  They are clean, dry, and intact.     Neurological:     General: No focal deficit present.     Mental Status: She is alert and oriented to person, place, and time.     Cranial Nerves: No cranial nerve deficit.  Psychiatric:        Mood and Affect: Mood normal.        Behavior: Behavior normal.        Judgment: Judgment normal.   Female chaperone present for pelvic exam:   Assessment: 40 y.o. s/p above surgery progressing well  Plan: Patient has done well after surgery with no apparent complications.  I have discussed the post-operative course to date, and the expected progress moving forward.  The patient understands what complications to be concerned about.  I will see the patient in routine follow up, or sooner if needed.    Activity plan: increase slowly. No intercourse for 8-12 weeks.  Prentice Docker, MD  02/15/2021, 10:42 AM

## 2021-05-04 ENCOUNTER — Ambulatory Visit (INDEPENDENT_AMBULATORY_CARE_PROVIDER_SITE_OTHER): Payer: BC Managed Care – PPO | Admitting: Obstetrics & Gynecology

## 2021-05-04 ENCOUNTER — Other Ambulatory Visit: Payer: Self-pay

## 2021-05-04 ENCOUNTER — Encounter: Payer: Self-pay | Admitting: Obstetrics & Gynecology

## 2021-05-04 VITALS — BP 120/80 | Ht 67.0 in | Wt 196.0 lb

## 2021-05-04 DIAGNOSIS — R102 Pelvic and perineal pain: Secondary | ICD-10-CM | POA: Diagnosis not present

## 2021-05-04 NOTE — Progress Notes (Signed)
Obstetrics & Gynecology Office Visit   Chief Complaint: Pain and different feeling w intercourse  History of Present Illness: 40 y.o. G1P0010 presenting for initial evaluation of dyspareunia and spouse feeling something like a stitch in vagina; she is s/p TLH 12 weeks ago.  Symptoms onset was several days ago.  The patient is not menopausal.  She is not currently on any HRT or hormonal medications.    Pain is most pronounced with deep penetration and during coitus.  She denies postcoital spotting.    She denies a history of sexually transmitted infections.  Associated symptoms include none.  She denies recent antibiotic exposure, denies changes in soaps, detergents coinciding with the onset of her symptoms.  She has not previously self treated or been under treatment by another provider for these symptoms.   Review of Systems  Constitutional:  Negative for chills, fever and malaise/fatigue.  HENT:  Negative for congestion, sinus pain and sore throat.   Eyes:  Negative for blurred vision and pain.  Respiratory:  Negative for cough and wheezing.   Cardiovascular:  Negative for chest pain and leg swelling.  Gastrointestinal:  Negative for abdominal pain, constipation, diarrhea, heartburn, nausea and vomiting.  Genitourinary:  Negative for dysuria, frequency, hematuria and urgency.  Musculoskeletal:  Negative for back pain, joint pain, myalgias and neck pain.  Skin:  Negative for itching and rash.  Neurological:  Negative for dizziness, tremors and weakness.  Endo/Heme/Allergies:  Does not bruise/bleed easily.  Psychiatric/Behavioral:  Negative for depression. The patient is not nervous/anxious and does not have insomnia.    Past Medical History:  Past Medical History:  Diagnosis Date   Anemia    Anxiety    Depression    Endometriosis    Fibroid, uterine    PONV (postoperative nausea and vomiting)     Past Surgical History:  Past Surgical History:  Procedure Laterality Date    CYSTOSCOPY N/A 01/04/2021   Procedure: CYSTOSCOPY;  Surgeon: Will Bonnet, MD;  Location: [facility];  Service: Gynecology;  Laterality: N/A;   DILATION AND CURETTAGE OF UTERUS  09/15/2014   miscarriage    DILATION AND CURETTAGE OF UTERUS  2014   Heavy bleeding    LAPAROSCOPIC GELPORT ASSISTED MYOMECTOMY  [id number]   MYOMECTOMY VAGINAL APPROACH  2014, 2015   ROBOTIC ASSISTED TOTAL HYSTERECTOMY WITH BILATERAL SALPINGO OOPHERECTOMY Bilateral 01/04/2021   Procedure: XI ROBOTIC ASSISTED TOTAL HYSTERECTOMY WITH BILATERAL SALPINGectomy;  Surgeon: Will Bonnet, MD;  Location: [facility];  Service: Gynecology;  Laterality: Bilateral;    Gynecologic History: Patient's last menstrual period was 11/23/2020.  Obstetric History: G1P0010  Family History:  Family History  Problem Relation Age of Onset   Lung cancer Maternal Grandfather    Colon cancer Paternal Grandfather     Social History:  Social History   Socioeconomic History   Marital status: Married    Spouse name: Not on file   Number of children: Not on file   Years of education: Not on file   Highest education level: Not on file  Occupational History   Not on file  Tobacco Use   Smoking status: Former    Types: Cigarettes   Smokeless tobacco: Never  Vaping Use   Vaping Use: Former  Substance and Sexual Activity   Alcohol use: Yes    Alcohol/week: 6.0 standard drinks    Types: 6 Cans of beer per week    Comment: WEEKEND   Drug use: Never   Sexual activity:  Yes    Birth control/protection: Pill  Other Topics Concern   Not on file  Social History Narrative   Not on file   Social Determinants of Health   Financial Resource Strain: Not on file  Food Insecurity: Not on file  Transportation Needs: Not on file  Physical Activity: Not on file  Stress: Not on file  Social Connections: Not on file  Intimate Partner Violence: Not on file    Allergies:  Allergies  Allergen Reactions   Oxycodone Rash   Zoloft  [Sertraline Hcl] Rash    Medications: Prior to Admission medications   Medication Sig Start Date End Date Taking? Authorizing Provider  bismuth subsalicylate (PEPTO BISMOL) 262 MG/15ML suspension Take 30 mLs by mouth every 6 (six) hours as needed for diarrhea or loose stools or indigestion. Patient not taking: Reported on 01/12/2021    [provider]  calcium carbonate (TUMS - DOSED IN MG ELEMENTAL CALCIUM) 500 MG chewable tablet Chew 500 mg by mouth daily as needed for indigestion or heartburn. Patient not taking: Reported on 01/12/2021    [provider]  ibuprofen (ADVIL) 600 MG tablet Take 1 tablet (600 mg total) by mouth every 6 (six) hours. 01/04/21   Will Bonnet, MD  Iron, Ferrous Sulfate, 325 (65 Fe) MG TABS Take 325 mg by mouth 2 (two) times a week. Patient not taking: Reported on 01/12/2021 10/07/20   [provider]  Melatonin 5 MG CAPS Take 5 mg by mouth at bedtime as needed (sleep). Patient not taking: Reported on 01/12/2021 04/19/19   [provider]  ondansetron (ZOFRAN ODT) 4 MG disintegrating tablet Take 1 tablet (4 mg total) by mouth every 8 (eight) hours as needed for nausea or vomiting. Patient not taking: Reported on 01/12/2021 01/04/21   Will Bonnet, MD    Physical Exam Blood pressure 120/80, height 5\' 7"  (1.702 m), weight 196 lb (88.9 kg), last menstrual period 11/23/2020.  Patient's last menstrual period was 11/23/2020.  General: NAD HEENT: normocephalic, anicteric Thyroid: no enlargement, no palpable nodules Pulmonary: No increased work of breathing Cardiovascular: RRR, distal pulses 2+ Abdomen: NABS, soft, non-tender, non-distended.  Umbilicus without lesions.  No hepatomegaly, splenomegaly or masses palpable. No evidence of hernia  Genitourinary:  External: Normal external female genitalia.  Normal urethral meatus, normal  Bartholin's and Skene's glands.    Vagina: Normal vaginal mucosa, no evidence of prolapse, no  evidence for stitch or granulation tissue or bleeding.  Thickened at apex on palpation.    Cervix: absent  Uterus: absent  Adnexa: ovaries non-enlarged, no adnexal masses  Rectal: deferred  Lymphatic: no evidence of inguinal lymphadenopathy Extremities: no edema, erythema, or tenderness Neurologic: Grossly intact Psychiatric: mood appropriate, affect full  Female chaperone present for pelvic  portions of the physical exam  Assessment: 40 y.o.   ICD-10-CM   1. Vaginal pain  R10.2     Prior TLH 12+ weeks ago, healing well  Plan: No stitch, but some thickening or scar tissue noted at vag apex Cont to allow to heal, but can be sexually active during this time  Fatigue and bladder function discussed, may also improve w time and reg exercise   Barnett Applebaum, MD, [facility] 05/04/2021  11:34 AM

## 2023-06-19 ENCOUNTER — Ambulatory Visit: Payer: Self-pay
